# Patient Record
Sex: Female | Born: 1950 | Race: White | Hispanic: No | Smoking: Never smoker
Health system: Southern US, Community
[De-identification: ages and names within clinical notes are randomized; demographics above are authoritative.]

## PROBLEM LIST (undated history)

## (undated) DIAGNOSIS — K644 Residual hemorrhoidal skin tags: Secondary | ICD-10-CM

## (undated) DIAGNOSIS — E785 Hyperlipidemia, unspecified: Secondary | ICD-10-CM

## (undated) DIAGNOSIS — D126 Benign neoplasm of colon, unspecified: Secondary | ICD-10-CM

## (undated) DIAGNOSIS — K5790 Diverticulosis of intestine, part unspecified, without perforation or abscess without bleeding: Secondary | ICD-10-CM

## (undated) DIAGNOSIS — I340 Nonrheumatic mitral (valve) insufficiency: Secondary | ICD-10-CM

## (undated) DIAGNOSIS — E538 Deficiency of other specified B group vitamins: Secondary | ICD-10-CM

## (undated) DIAGNOSIS — I471 Supraventricular tachycardia, unspecified: Secondary | ICD-10-CM

## (undated) DIAGNOSIS — K648 Other hemorrhoids: Secondary | ICD-10-CM

## (undated) DIAGNOSIS — M8580 Other specified disorders of bone density and structure, unspecified site: Secondary | ICD-10-CM

## (undated) DIAGNOSIS — L900 Lichen sclerosus et atrophicus: Secondary | ICD-10-CM

## (undated) HISTORY — DX: Deficiency of other specified B group vitamins: E53.8

## (undated) HISTORY — DX: Supraventricular tachycardia: I47.1

## (undated) HISTORY — DX: Other hemorrhoids: K64.8

## (undated) HISTORY — DX: Benign neoplasm of colon, unspecified: D12.6

## (undated) HISTORY — DX: Supraventricular tachycardia, unspecified: I47.10

## (undated) HISTORY — DX: Residual hemorrhoidal skin tags: K64.4

## (undated) HISTORY — PX: BREAST EXCISIONAL BIOPSY: SUR124

## (undated) HISTORY — DX: Nonrheumatic mitral (valve) insufficiency: I34.0

## (undated) HISTORY — DX: Hyperlipidemia, unspecified: E78.5

## (undated) HISTORY — DX: Lichen sclerosus et atrophicus: L90.0

## (undated) HISTORY — PX: COLONOSCOPY: SHX174

## (undated) HISTORY — PX: BREAST BIOPSY: SHX20

## (undated) HISTORY — DX: Other specified disorders of bone density and structure, unspecified site: M85.80

## (undated) HISTORY — DX: Diverticulosis of intestine, part unspecified, without perforation or abscess without bleeding: K57.90

---

## 2013-11-24 ENCOUNTER — Other Ambulatory Visit: Payer: Self-pay

## 2013-12-30 ENCOUNTER — Encounter: Payer: Self-pay | Admitting: Internal Medicine

## 2013-12-30 ENCOUNTER — Ambulatory Visit (INDEPENDENT_AMBULATORY_CARE_PROVIDER_SITE_OTHER): Payer: BC Managed Care – PPO | Admitting: Internal Medicine

## 2013-12-30 ENCOUNTER — Other Ambulatory Visit: Payer: Self-pay | Admitting: Internal Medicine

## 2013-12-30 VITALS — BP 124/86 | HR 65 | Temp 97.8°F | Resp 12 | Ht 68.0 in | Wt 118.0 lb

## 2013-12-30 DIAGNOSIS — E785 Hyperlipidemia, unspecified: Secondary | ICD-10-CM

## 2013-12-30 DIAGNOSIS — Z872 Personal history of diseases of the skin and subcutaneous tissue: Secondary | ICD-10-CM | POA: Insufficient documentation

## 2013-12-30 DIAGNOSIS — Z1231 Encounter for screening mammogram for malignant neoplasm of breast: Secondary | ICD-10-CM

## 2013-12-30 DIAGNOSIS — L900 Lichen sclerosus et atrophicus: Secondary | ICD-10-CM | POA: Insufficient documentation

## 2013-12-30 NOTE — Patient Instructions (Signed)
We will send your prescription for your mammogram to the breast center. You can call them to schedule.   Think about calling your insurance company to see if they will cover the shingles shot (zostavax) and if you need to get it at our office or at a pharmacy. If you decide to do that you can just call our office.  We will see you back next year for your physical and if you have any problems or questions before then please call our office.

## 2013-12-30 NOTE — Assessment & Plan Note (Signed)
Has followed with derm and ob/gyn and currently under reasonable control.

## 2013-12-30 NOTE — Assessment & Plan Note (Signed)
She's had fewer since stopping caffeine and order mammogram today.

## 2013-12-30 NOTE — Assessment & Plan Note (Signed)
Check lipid panel today. Not currently on medication but reminded about healthy diet and lifestyle.

## 2013-12-30 NOTE — Progress Notes (Signed)
Pre visit review using our clinic review tool, if applicable. No additional management support is needed unless otherwise documented below in the visit note. 

## 2013-12-30 NOTE — Progress Notes (Signed)
   Subjective:    Patient ID: Patricia Dillon, female    DOB: 19-May-1950, 62 y.o.   MRN: [id number]  HPI The patient is a 63 YO female who comes in today to establish care. She has PMH of lichen sclerosis, vitamin D deficiency. She is not having any problems right now but wanted to get a mammogram. She denies chest pains, SOB, abdominal pain, GERD. She denies joint pains or aches, balance problems.   Review of Systems  Constitutional: Negative for fever, activity change, appetite change, fatigue and unexpected weight change.  HENT: Negative.   Respiratory: Negative for cough, chest tightness, shortness of breath and wheezing.   Cardiovascular: Negative for chest pain, palpitations and leg swelling.  Gastrointestinal: Negative for abdominal pain, diarrhea, constipation and abdominal distention.  Musculoskeletal: Negative.   Skin: Negative.   Neurological: Negative.       Objective:   Physical Exam  Constitutional: She is oriented to person, place, and time. She appears well-developed and well-nourished.  HENT:  Head: Normocephalic and atraumatic.  Eyes: EOM are normal.  Neck: Normal range of motion.  Cardiovascular: Normal rate and regular rhythm.   No murmur heard. Pulmonary/Chest: Effort normal and breath sounds normal. No respiratory distress. She has no wheezes. She has no rales.  Abdominal: Soft. Bowel sounds are normal. She exhibits no distension. There is no tenderness. There is no rebound.  Musculoskeletal: She exhibits no edema or tenderness.  Neurological: She is alert and oriented to person, place, and time.  Skin: Skin is warm and dry.   Filed Vitals:   12/30/13 0923  BP: 124/86  Pulse: 65  Temp: 97.8 F (36.6 C)  TempSrc: Oral  Resp: 12  Height: 5\' 8"  (1.727 m)  Weight: 118 lb (53.524 kg)  SpO2: 98%      Assessment & Plan:

## 2014-01-14 ENCOUNTER — Encounter: Payer: Self-pay | Admitting: Internal Medicine

## 2014-01-22 ENCOUNTER — Ambulatory Visit: Payer: Self-pay

## 2014-01-22 ENCOUNTER — Ambulatory Visit: Payer: BC Managed Care – PPO

## 2014-02-03 ENCOUNTER — Ambulatory Visit
Admission: RE | Admit: 2014-02-03 | Discharge: 2014-02-03 | Disposition: A | Discharge status: Home / Self Care | Payer: BLUE CROSS/BLUE SHIELD | Source: Ambulatory Visit | Attending: Internal Medicine | Admitting: Internal Medicine

## 2014-02-03 DIAGNOSIS — Z1231 Encounter for screening mammogram for malignant neoplasm of breast: Secondary | ICD-10-CM

## 2014-02-05 ENCOUNTER — Ambulatory Visit: Payer: Self-pay

## 2014-04-09 ENCOUNTER — Encounter: Payer: Self-pay | Admitting: Internal Medicine

## 2014-04-09 ENCOUNTER — Ambulatory Visit (INDEPENDENT_AMBULATORY_CARE_PROVIDER_SITE_OTHER): Payer: BLUE CROSS/BLUE SHIELD | Admitting: Internal Medicine

## 2014-04-09 VITALS — BP 100/62 | HR 66 | Temp 98.2°F | Resp 12 | Ht 68.0 in | Wt 119.0 lb

## 2014-04-09 DIAGNOSIS — E041 Nontoxic single thyroid nodule: Secondary | ICD-10-CM | POA: Diagnosis not present

## 2014-04-09 NOTE — Patient Instructions (Addendum)
Please come back in 1 year. 

## 2014-04-09 NOTE — Progress Notes (Signed)
Patient ID: Patricia Dillon, female   DOB: Apr 14, 1950, 64 y.o.   MRN: [id number]   HPI  Patricia Dillon is a 64 y.o.-year-old female, referred by her PCP, Dr. Doug Sou, for management of thyroid nodule. She moved from [place] in 10/2013.   Her thyroid nodule was incidentally found on MRI of the neck in 2010. It was followed with serial U/S's.   Thyroid U/S - R thyroid nodule: - 10/27/2012: 0.72 x 0.55 x 0.71 cm - has vascularity and 2 hyperechoic areas that appear as microcalcifications - 07/12/2010: 0.63 x 0.37 x 0.39 cm - 05/07/2009: 0.56 x 0.36 x 0.47 cm - 09/30/2008: 0.7 x 0.4 x 0.4 cm Thyroid gland was homogeneous on U/S.  2014:   I reviewed pt's thyroid tests: 08/16/2012: TSH 2.480, fT4 1.23   Pt denies feeling nodules in neck, hoarseness, dysphagia/odynophagia, SOB with lying down.  Pt c/o: - + palpitations - not recent Denies: - heat intolerance/cold intolerance - tremors - anxiety/depression - hyperdefecation/constipation - weight loss - weight gain - dry skin - hair falling - problems with concentration - fatigue  Pt does not have a FH of thyroid ds. No FH of thyroid cancer. No h/o radiation tx to head or neck.  No seaweed or kelp, no recent contrast studies. No steroid use. No herbal supplements.   I reviewed her chart and she also has a history of Mitral regurgitation. She also has osteopenia. .  ROS: Constitutional: no weight gain/loss, no fatigue, no subjective hyperthermia/hypothermia Eyes: no blurry vision, no xerophthalmia ENT: no sore throat, no nodules palpated in throat, no dysphagia/odynophagia, no hoarseness Cardiovascular: no CP/SOB/+ palpitations/no leg swelling Respiratory: no cough/SOB Gastrointestinal: no N/V/D/C Musculoskeletal: no muscle/joint aches Skin: no rashes Neurological: no tremors/numbness/tingling/dizziness Psychiatric: no depression/anxiety  Past Medical History  Diagnosis Date  . Hyperlipidemia   . Mitral valve  regurgitation   . Lichen sclerosus    Past Surgical History  Procedure Laterality Date  . Breast biopsy     History   Social History  . Marital Status: Divorced    Spouse Name: N/A  . Number of Children: 2   Occupational History  . Scientist, water quality.   Social History Main Topics  . Smoking status: Never Smoker   . Smokeless tobacco: Not on file  . Alcohol Use: No  . Drug Use: No   Current Outpatient Prescriptions on File Prior to Visit  Medication Sig Dispense Refill  . Cholecalciferol (VITAMIN D) 2000 UNITS tablet Take 2,000 Units by mouth daily.    Marland Kitchen PRAMOSONE E 1-2.5 % CREA Apply 1 - 2 times daily as needed.  2   No current facility-administered medications on file prior to visit.   Allergies  Allergen Reactions  . Penicillins   . Vancomycin    Family History  Problem Relation Age of Onset  . Hypertension Mother   . Heart disease Father    PE: BP 100/62 mmHg  Pulse 66  Temp(Src) 98.2 F (36.8 C) (Oral)  Resp 12  Ht 5\' 8"  (1.727 m)  Wt 119 lb (53.978 kg)  BMI 18.10 kg/m2  SpO2 97% Wt Readings from Last 3 Encounters:  04/09/14 119 lb (53.978 kg)  12/30/13 118 lb (53.524 kg)   Constitutional: normal weight, in NAD Eyes: PERRLA, EOMI, no exophthalmos ENT: moist mucous membranes, no thyromegaly, no cervical lymphadenopathy Cardiovascular: RRR (with few skipped beats), No MRG Respiratory: CTA B Gastrointestinal: abdomen soft, NT, ND, BS+ Musculoskeletal: no deformities, strength intact in all 4;  Skin: moist,  warm, no rashes Neurological: no tremor with outstretched hands, DTR normal in all 4  ASSESSMENT: 1. Thyroid nodule - small  PLAN: 1.  - I reviewed the images of her thyroid ultrasound along with the patient (she brought the CD of her latest U/S from 2014). I pointed out that the R thyroid nodule is very small, without internal blood flow, more wide than tall, and well delimited from surrounding tissue. It is hypoechoic and has 2 possible calcifications  vs colloid crystals. Pt does not have a thyroid cancer family history or a personal history of RxTx to head/neck. Moreover, the nodule was stable in size over 5 years: 2010-2014.  - the only way that we can tell exactly if it is cancer or not is by doing a thyroid biopsy (FNA), however, the nodule is too small for this - We discussed about repeating the U/S in 1 year to follow size and if growing, my threshold for Bx would be 1 cm - we discussed about thyroid cancer >> very indolent, slow growing - she agrees to wait 1 more year until the next U/S - she should let me know if she develops neck compression symptoms, in that case, we might need to do either lobectomy or thyroidectomy - I'll see her back in a year  - check TSH today - I advised pt to join my chart and I will send her the results through there   Lab Results  Component Value Date   TSH 1.300 04/09/2014   Thyroiditis is normal.

## 2014-04-10 LAB — TSH: TSH: 1.3 u[IU]/mL (ref 0.450–4.500)

## 2014-05-18 ENCOUNTER — Encounter: Payer: Self-pay | Admitting: Internal Medicine

## 2014-11-30 ENCOUNTER — Ambulatory Visit (INDEPENDENT_AMBULATORY_CARE_PROVIDER_SITE_OTHER): Payer: BLUE CROSS/BLUE SHIELD | Admitting: Internal Medicine

## 2014-11-30 ENCOUNTER — Encounter: Payer: Self-pay | Admitting: Internal Medicine

## 2014-11-30 VITALS — BP 128/82 | HR 74 | Temp 98.4°F | Resp 20 | Ht 68.0 in | Wt 119.1 lb

## 2014-11-30 DIAGNOSIS — Z299 Encounter for prophylactic measures, unspecified: Secondary | ICD-10-CM

## 2014-11-30 DIAGNOSIS — Z418 Encounter for other procedures for purposes other than remedying health state: Secondary | ICD-10-CM | POA: Diagnosis not present

## 2014-11-30 DIAGNOSIS — R590 Localized enlarged lymph nodes: Secondary | ICD-10-CM | POA: Diagnosis not present

## 2014-11-30 NOTE — Progress Notes (Signed)
Subjective:    Patient ID: Patricia Dillon, female    DOB: [birth year], 64 y.o.   MRN: [id number]  HPI She is here for an acute visit for the flu shot and she has noticed some swollen lymph nodes in her neck.  She had the tdap on 11/3 and is unsure if that is related.   She had the flu shot several years ago and after had tingling in her scalp.  She is unsure if that was a side effect or allergic reaction - she has not had one since then.  She wants one now because her daughter is pregnant and she knows she should have one.   Her swollen neck glands started two weeks ago.  They are better some days, but never completely go away.  She has left sided TMJ and has been having some left ear pain.  She is unsure if the pain is related to TMJ or something else.  She does not feel sick.  She is a little more tired, but her daughter has placenta previa and she has been worried about her.      Medications and allergies reviewed with patient and updated if appropriate.  Patient Active Problem List   Diagnosis Date Noted  . Thyroid nodule 04/09/2014  . Lichen sclerosus 123456  . Hyperlipidemia 12/30/2013  . Hx of cyst of breast 12/30/2013    Current Outpatient Prescriptions on File Prior to Visit  Medication Sig Dispense Refill  . Cholecalciferol (VITAMIN D) 2000 UNITS tablet Take 1,000 Units by mouth daily.     Marland Kitchen PRAMOSONE E 1-2.5 % CREA Apply 1 - 2 times daily as needed.  2   No current facility-administered medications on file prior to visit.    Past Medical History  Diagnosis Date  . Hyperlipidemia   . Mitral valve regurgitation   . Lichen sclerosus     Past Surgical History  Procedure Laterality Date  . Breast biopsy      Social History   Social History  . Marital Status: Divorced    Spouse Name: N/A  . Number of Children: N/A  . Years of Education: N/A   Social History Main Topics  . Smoking status: Never Smoker   . Smokeless tobacco: None  . Alcohol Use: No  .  Drug Use: No  . Sexual Activity: Not Asked   Other Topics Concern  . None   Social History Narrative    Review of Systems  Constitutional: Negative for fever, chills and appetite change.  HENT: Positive for ear pain (left, ? TMJ) and sore throat (scratchy - ? related to having heat on). Negative for congestion, postnasal drip, sinus pressure and sneezing.   Respiratory: Negative for cough, shortness of breath and wheezing.   Cardiovascular: Negative for chest pain and palpitations.  Gastrointestinal: Negative for nausea, abdominal pain, diarrhea and constipation.       Objective:   Filed Vitals:   11/30/14 0936  BP: 128/82  Pulse: 74  Temp: 98.4 F (36.9 C)  Resp: 20   Filed Weights   11/30/14 0936  Weight: 119 lb 2 oz (54.035 kg)   Body mass index is 18.12 kg/(m^2).   Physical Exam  Constitutional: She appears well-developed and well-nourished. No distress.  HENT:  Head: Normocephalic and atraumatic.  Right Ear: External ear normal.  Left Ear: External ear normal.  Nose: Nose normal.  Mouth/Throat: Oropharynx is clear and moist. No oropharyngeal exudate.  Bilateral ear canals and tympanic membranes  normal  Eyes: Conjunctivae are normal.  Neck: Neck supple. No tracheal deviation present. No thyromegaly present.  Cardiovascular: Normal rate, regular rhythm and normal heart sounds.   No murmur heard. Pulmonary/Chest: Effort normal and breath sounds normal. No respiratory distress. She has no wheezes. She has no rales.  Musculoskeletal: She exhibits no edema.  Lymphadenopathy:    She has cervical adenopathy (Mild, bilateral anterior cervical; no occipital, mandibular, auricular or supraclavicular lymphadenopathy).  Skin: Skin is warm and dry. No rash noted. She is not diaphoretic.  Psychiatric: She has a normal mood and affect. Her behavior is normal.        Assessment & Plan:   Flu shot given today - she is not febrile.  Discussed possible side effects and  advised her to call with any questions/concerns  Cervical lymphadenopathy Mild anterior cervical lymphadenopathy, no other neck lympahdenopathy No concerning symptoms or exam findings No further evaluation at this time She did have a cbc earlier this month through gyn which she reports was normal She will monitor lymph nodes for now - if they persist we can order a neck US to evaluate thyroid (has a known nodule that is small and has been stable) and neck lymph nodes  Follow up as needed

## 2014-11-30 NOTE — Patient Instructions (Signed)
Monitor your swollen lymph nodes - if they persist call and let me know.  You received the flu vaccine today.

## 2014-11-30 NOTE — Progress Notes (Signed)
Pre visit review using our clinic review tool, if applicable. No additional management support is needed unless otherwise documented below in the visit note. 

## 2014-12-31 ENCOUNTER — Other Ambulatory Visit: Payer: Self-pay

## 2014-12-31 DIAGNOSIS — Z1231 Encounter for screening mammogram for malignant neoplasm of breast: Secondary | ICD-10-CM

## 2015-02-22 ENCOUNTER — Ambulatory Visit
Admission: RE | Admit: 2015-02-22 | Discharge: 2015-02-22 | Disposition: A | Discharge status: Home / Self Care | Payer: BLUE CROSS/BLUE SHIELD | Source: Ambulatory Visit

## 2015-02-22 DIAGNOSIS — Z1231 Encounter for screening mammogram for malignant neoplasm of breast: Secondary | ICD-10-CM

## 2015-03-25 ENCOUNTER — Encounter: Payer: BLUE CROSS/BLUE SHIELD | Admitting: Internal Medicine

## 2015-04-09 ENCOUNTER — Ambulatory Visit: Payer: BLUE CROSS/BLUE SHIELD | Admitting: Internal Medicine

## 2015-05-03 ENCOUNTER — Encounter: Payer: BLUE CROSS/BLUE SHIELD | Admitting: Internal Medicine

## 2015-05-06 ENCOUNTER — Ambulatory Visit (INDEPENDENT_AMBULATORY_CARE_PROVIDER_SITE_OTHER): Payer: BLUE CROSS/BLUE SHIELD | Admitting: Internal Medicine

## 2015-05-06 VITALS — BP 112/70 | HR 55 | Wt 122.6 lb

## 2015-05-06 DIAGNOSIS — E041 Nontoxic single thyroid nodule: Secondary | ICD-10-CM | POA: Diagnosis not present

## 2015-05-06 NOTE — Progress Notes (Signed)
Pre visit review using our clinic review tool, if applicable. No additional management support is needed unless otherwise documented below in the visit note. 

## 2015-05-06 NOTE — Progress Notes (Signed)
Patient ID: Patricia Dillon, female   DOB: [birth year], 65 y.o.   MRN: [id number]   HPI  Patricia Dillon is a 65 y.o.-year-old female, initially referred by her PCP, Dr. Doug Sou, for management of thyroid nodule. She moved from [place] in 10/2013. Last visit 1 year ago.  Her thyroid nodule was incidentally found on MRI of the neck in 2010. It was followed with serial U/S's.   Thyroid U/S - R thyroid nodule: - 09/30/2008: 0.7 x 0.4 x 0.4 cm - 05/07/2009: 0.56 x 0.36 x 0.47 cm - 07/12/2010: 0.63 x 0.37 x 0.39 cm - 10/27/2012: 0.72 x 0.55 x 0.71 cm - 2 hyperechoic areas that appear as microcalcifications  Thyroid gland was homogeneous on U/S.  Images from the 2014 U/S:   I reviewed pt's thyroid tests: Lab Results  Component Value Date   TSH 1.300 04/09/2014  08/16/2012: TSH 2.480, fT4 1.23   Pt denies feeling nodules in neck, hoarseness, dysphagia/odynophagia, SOB with lying down.  Pt c/o: - + palpitations - not recent Denies: - heat intolerance/cold intolerance - tremors - anxiety/depression - hyperdefecation/constipation - weight loss - weight gain - dry skin - hair falling - problems with concentration - fatigue  Pt does not have a FH of thyroid ds. No FH of thyroid cancer. No h/o radiation tx to head or neck.  No seaweed or kelp, no recent contrast studies. No steroid use. No herbal supplements.   I reviewed her chart and she also has a history of Mitral regurgitation. She also has osteopenia. .  ROS: Constitutional: no weight gain/loss, no fatigue, no subjective hyperthermia/hypothermia Eyes: no blurry vision, no xerophthalmia ENT: no sore throat, no nodules palpated in throat, no dysphagia/odynophagia, no hoarseness Cardiovascular: no CP/SOB/+ palpitations/no leg swelling Respiratory: no cough/SOB Gastrointestinal: no N/V/D/C Musculoskeletal: no muscle/joint aches Skin: no rashes Neurological: no tremors/numbness/tingling/dizziness  I reviewed pt's  medications, allergies, PMH, social hx, family hx, and changes were documented in the history of present illness. Otherwise, unchanged from my initial visit note.  Past Medical History  Diagnosis Date  . Hyperlipidemia   . Mitral valve regurgitation   . Lichen sclerosus    Past Surgical History  Procedure Laterality Date  . Breast biopsy     History   Social History  . Marital Status: Divorced    Spouse Name: N/A  . Number of Children: 2   Occupational History  . Scientist, water quality.   Social History Main Topics  . Smoking status: Never Smoker   . Smokeless tobacco: Not on file  . Alcohol Use: No  . Drug Use: No   Current Outpatient Prescriptions on File Prior to Visit  Medication Sig Dispense Refill  . Cholecalciferol (VITAMIN D) 2000 UNITS tablet Take 1,000 Units by mouth daily.     Marland Kitchen PRAMOSONE E 1-2.5 % CREA Apply 1 - 2 times daily as needed.  2   No current facility-administered medications on file prior to visit.   Allergies  Allergen Reactions  . Penicillins   . Vancomycin    Family History  Problem Relation Age of Onset  . Hypertension Mother   . Heart disease Father    PE: BP 112/70 mmHg  Pulse 55  Wt 122 lb 9.6 oz (55.611 kg)  SpO2 97% Body mass index is 18.65 kg/(m^2). Wt Readings from Last 3 Encounters:  05/06/15 122 lb 9.6 oz (55.611 kg)  11/30/14 119 lb 2 oz (54.035 kg)  04/09/14 119 lb (53.978 kg)   Constitutional: normal weight, in  NAD Eyes: PERRLA, EOMI, no exophthalmos ENT: moist mucous membranes, no thyromegaly, no cervical lymphadenopathy Cardiovascular: RRR (with skipped beats), No MRG Respiratory: CTA B Gastrointestinal: abdomen soft, NT, ND, BS+ Musculoskeletal: no deformities, strength intact in all 4;  Skin: moist, warm, no rashes Neurological: no tremor with outstretched hands, DTR normal in all 4  ASSESSMENT: 1. R Thyroid nodule - small  PLAN: 1.  - I reviewed the images of her 2014 thyroid ultrasound along with the patient - the R  thyroid nodule is very small, without internal blood flow, more wide than tall, and well delimited from surrounding tissue. It is hypoechoic and has 2 possible calcifications vs colloid crystals. Pt does not have a thyroid cancer family history or a personal history of RxTx to head/neck. Moreover, the nodule was stable in size over 5 years: 2010-2014.  - the only way that we can tell exactly if it is cancer or not is by doing a thyroid biopsy (FNA), however, the nodule is too small for this - We discussed about repeating the U/S now to follow size and if growing, we may need to Bx it - she should let me know if she develops neck compression symptoms - I'll see her back in 2 years - she will have a TSH checked next mo (APE with PCP). Last TSH 1 year ago >> normal. - I advised pt to join my chart and I will send her the results through there   05/10/2015: Thyroid ultrasound:  Right thyroid lobe: 40 x 13 x 14 mm.  - 9 x 7 x 8 mm solid nodule with coarse calcification, inferior pole (previously 7 x 6 x 7).  - 3 mm hypoechoic nodule, superior pole.  Left thyroid lobe: 42 x 9 x 11 mm.  - Two small colloid nodules less than 4 mm.  Isthmus Thickness: 1.4 mm. No nodules visualized.  Lymphadenopathy: None visualized.  IMPRESSION: 1. Small bilateral nodules as above. Findings do not meet current consensus criteria for biopsy. Follow-up by clinical exam is recommended.    Reviewed images >> nodule appears isoechoic, and the "coarse calcification" appears to be a colloid granule with comet tail sign. The nodule appears low risk for cancer >> will continue to follow, but no Bx needed for now.

## 2015-05-06 NOTE — Patient Instructions (Signed)
Please schedule a new thyroid U/S.   Please have a TSH checked at next visit with PCP.  Please return in 2 years.

## 2015-05-10 ENCOUNTER — Ambulatory Visit
Admission: RE | Admit: 2015-05-10 | Discharge: 2015-05-10 | Disposition: A | Discharge status: Home / Self Care | Payer: BLUE CROSS/BLUE SHIELD | Source: Ambulatory Visit | Attending: Internal Medicine | Admitting: Internal Medicine

## 2015-05-10 ENCOUNTER — Inpatient Hospital Stay
Admission: RE | Admit: 2015-05-10 | Discharge: 2015-05-10 | Disposition: A | Discharge status: Home / Self Care | Payer: Self-pay | Source: Ambulatory Visit | Attending: Internal Medicine | Admitting: Internal Medicine

## 2015-05-10 ENCOUNTER — Other Ambulatory Visit: Payer: Self-pay | Admitting: Internal Medicine

## 2015-05-10 DIAGNOSIS — E041 Nontoxic single thyroid nodule: Secondary | ICD-10-CM

## 2015-05-12 ENCOUNTER — Encounter: Payer: Self-pay | Admitting: Internal Medicine

## 2015-06-24 ENCOUNTER — Other Ambulatory Visit (INDEPENDENT_AMBULATORY_CARE_PROVIDER_SITE_OTHER): Payer: BLUE CROSS/BLUE SHIELD

## 2015-06-24 ENCOUNTER — Encounter: Payer: Self-pay | Admitting: Internal Medicine

## 2015-06-24 ENCOUNTER — Ambulatory Visit (INDEPENDENT_AMBULATORY_CARE_PROVIDER_SITE_OTHER): Payer: BLUE CROSS/BLUE SHIELD | Admitting: Internal Medicine

## 2015-06-24 VITALS — BP 116/60 | HR 83 | Temp 98.3°F | Resp 12 | Ht 68.0 in | Wt 122.8 lb

## 2015-06-24 DIAGNOSIS — Z0000 Encounter for general adult medical examination without abnormal findings: Secondary | ICD-10-CM | POA: Diagnosis not present

## 2015-06-24 DIAGNOSIS — E785 Hyperlipidemia, unspecified: Secondary | ICD-10-CM

## 2015-06-24 LAB — LIPID PANEL
CHOL/HDL RATIO: 3
Cholesterol: 196 mg/dL (ref 0–200)
HDL: 58.7 mg/dL (ref >39.00)
LDL Cholesterol: 108 mg/dL — ABNORMAL HIGH (ref 0–99)
NONHDL: 137.36
Triglycerides: 147 mg/dL (ref 0.0–149.0)
VLDL: 29.4 mg/dL (ref 0.0–40.0)

## 2015-06-24 LAB — COMPREHENSIVE METABOLIC PANEL
ALBUMIN: 4.5 g/dL (ref 3.5–5.2)
ALK PHOS: 100 U/L (ref 39–117)
ALT: 17 U/L (ref 0–35)
AST: 22 U/L (ref 0–37)
BUN: 15 mg/dL (ref 6–23)
CO2: 34 mEq/L — ABNORMAL HIGH (ref 19–32)
CREATININE: 0.9 mg/dL (ref 0.40–1.20)
Calcium: 9.5 mg/dL (ref 8.4–10.5)
Chloride: 104 mEq/L (ref 96–112)
GFR: 67.09 mL/min (ref >60.00)
GLUCOSE: 96 mg/dL (ref 70–99)
Potassium: 4.3 mEq/L (ref 3.5–5.1)
SODIUM: 137 meq/L (ref 135–145)
TOTAL PROTEIN: 7.3 g/dL (ref 6.0–8.3)
Total Bilirubin: 1 mg/dL (ref 0.2–1.2)

## 2015-06-24 LAB — CBC
HCT: 39.2 % (ref 36.0–46.0)
HEMOGLOBIN: 13.2 g/dL (ref 12.0–15.0)
MCHC: 33.7 g/dL (ref 30.0–36.0)
MCV: 90.9 fl (ref 78.0–100.0)
PLATELETS: 114 10*3/uL — AB (ref 150.0–400.0)
RBC: 4.32 Mil/uL (ref 3.87–5.11)
RDW: 13.2 % (ref 11.5–15.5)
WBC: 6.1 10*3/uL (ref 4.0–10.5)

## 2015-06-24 LAB — VITAMIN D 25 HYDROXY (VIT D DEFICIENCY, FRACTURES): VITD: 33.16 ng/mL (ref 30.00–100.00)

## 2015-06-24 LAB — TSH: TSH: 1.18 u[IU]/mL (ref 0.35–4.50)

## 2015-06-24 NOTE — Assessment & Plan Note (Signed)
Checking labs, colonoscopy and mammogram up to date. Declines HIV and hep c screening as well as shingles shot today. Counseled about the dangers of distracted driving. Given screening recommendations.

## 2015-06-24 NOTE — Assessment & Plan Note (Signed)
Checking lipid panel, not on meds right now. Adjust as needed.

## 2015-06-24 NOTE — Progress Notes (Signed)
Pre visit review using our clinic review tool, if applicable. No additional management support is needed unless otherwise documented below in the visit note. 

## 2015-06-24 NOTE — Patient Instructions (Signed)
Keep up the good work with the health.   Think about getting checked for hepatitis C in the future if you want.   Health Maintenance, Female Adopting a healthy lifestyle and getting preventive care can go a long way to promote health and wellness. Talk with your health care provider about what schedule of regular examinations is right for you. This is a good chance for you to check in with your provider about disease prevention and staying healthy. In between checkups, there are plenty of things you can do on your own. Experts have done a lot of research about which lifestyle changes and preventive measures are most likely to keep you healthy. Ask your health care provider for more information. WEIGHT AND DIET  Eat a healthy diet  Be sure to include plenty of vegetables, fruits, low-fat dairy products, and lean protein.  Do not eat a lot of foods high in solid fats, added sugars, or salt.  Get regular exercise. This is one of the most important things you can do for your health.  Most adults should exercise for at least 150 minutes each week. The exercise should increase your heart rate and make you sweat (moderate-intensity exercise).  Most adults should also do strengthening exercises at least twice a week. This is in addition to the moderate-intensity exercise.  Maintain a healthy weight  Body mass index (BMI) is a measurement that can be used to identify possible weight problems. It estimates body fat based on height and weight. Your health care provider can help determine your BMI and help you achieve or maintain a healthy weight.  For females 27 years of age and older:   A BMI below 18.5 is considered underweight.  A BMI of 18.5 to 24.9 is normal.  A BMI of 25 to 29.9 is considered overweight.  A BMI of 30 and above is considered obese.  Watch levels of cholesterol and blood lipids  You should start having your blood tested for lipids and cholesterol at 64 years of age,  then have this test every 5 years.  You may need to have your cholesterol levels checked more often if:  Your lipid or cholesterol levels are high.  You are older than 64 years of age.  You are at high risk for heart disease.  CANCER SCREENING   Lung Cancer  Lung cancer screening is recommended for adults 80-25 years old who are at high risk for lung cancer because of a history of smoking.  A yearly low-dose CT scan of the lungs is recommended for people who:  Currently smoke.  Have quit within the past 15 years.  Have at least a 30-pack-year history of smoking. A pack year is smoking an average of one pack of cigarettes a day for 1 year.  Yearly screening should continue until it has been 15 years since you quit.  Yearly screening should stop if you develop a health problem that would prevent you from having lung cancer treatment.  Breast Cancer  Practice breast self-awareness. This means understanding how your breasts normally appear and feel.  It also means doing regular breast self-exams. Let your health care provider know about any changes, no matter how small.  If you are in your 20s or 30s, you should have a clinical breast exam (CBE) by a health care provider every 1-3 years as part of a regular health exam.  If you are 54 or older, have a CBE every year. Also consider having a breast X-ray (  mammogram) every year.  If you have a family history of breast cancer, talk to your health care provider about genetic screening.  If you are at high risk for breast cancer, talk to your health care provider about having an MRI and a mammogram every year.  Breast cancer gene (BRCA) assessment is recommended for women who have family members with BRCA-related cancers. BRCA-related cancers include:  Breast.  Ovarian.  Tubal.  Peritoneal cancers.  Results of the assessment will determine the need for genetic counseling and BRCA1 and BRCA2 testing. Cervical Cancer Your  health care provider may recommend that you be screened regularly for cancer of the pelvic organs (ovaries, uterus, and vagina). This screening involves a pelvic examination, including checking for microscopic changes to the surface of your cervix (Pap test). You may be encouraged to have this screening done every 3 years, beginning at age 34.  For women ages 68-65, health care providers may recommend pelvic exams and Pap testing every 3 years, or they may recommend the Pap and pelvic exam, combined with testing for human papilloma virus (HPV), every 5 years. Some types of HPV increase your risk of cervical cancer. Testing for HPV may also be done on women of any age with unclear Pap test results.  Other health care providers may not recommend any screening for nonpregnant women who are considered low risk for pelvic cancer and who do not have symptoms. Ask your health care provider if a screening pelvic exam is right for you.  If you have had past treatment for cervical cancer or a condition that could lead to cancer, you need Pap tests and screening for cancer for at least 20 years after your treatment. If Pap tests have been discontinued, your risk factors (such as having a new sexual partner) need to be reassessed to determine if screening should resume. Some women have medical problems that increase the chance of getting cervical cancer. In these cases, your health care provider may recommend more frequent screening and Pap tests. Colorectal Cancer  This type of cancer can be detected and often prevented.  Routine colorectal cancer screening usually begins at 64 years of age and continues through 64 years of age.  Your health care provider may recommend screening at an earlier age if you have risk factors for colon cancer.  Your health care provider may also recommend using home test kits to check for hidden blood in the stool.  A small camera at the end of a tube can be used to examine your  colon directly (sigmoidoscopy or colonoscopy). This is done to check for the earliest forms of colorectal cancer.  Routine screening usually begins at age 18.  Direct examination of the colon should be repeated every 5-10 years through 64 years of age. However, you may need to be screened more often if early forms of precancerous polyps or small growths are found. Skin Cancer  Check your skin from head to toe regularly.  Tell your health care provider about any new moles or changes in moles, especially if there is a change in a mole's shape or color.  Also tell your health care provider if you have a mole that is larger than the size of a pencil eraser.  Always use sunscreen. Apply sunscreen liberally and repeatedly throughout the day.  Protect yourself by wearing long sleeves, pants, a wide-brimmed hat, and sunglasses whenever you are outside. HEART DISEASE, DIABETES, AND HIGH BLOOD PRESSURE   High blood pressure causes heart disease  and increases the risk of stroke. High blood pressure is more likely to develop in:  People who have blood pressure in the high end of the normal range (130-139/85-89 mm Hg).  People who are overweight or obese.  People who are African American.  If you are 54-49 years of age, have your blood pressure checked every 3-5 years. If you are 51 years of age or older, have your blood pressure checked every year. You should have your blood pressure measured twice--once when you are at a hospital or clinic, and once when you are not at a hospital or clinic. Record the average of the two measurements. To check your blood pressure when you are not at a hospital or clinic, you can use:  An automated blood pressure machine at a pharmacy.  A home blood pressure monitor.  If you are between 71 years and 12 years old, ask your health care provider if you should take aspirin to prevent strokes.  Have regular diabetes screenings. This involves taking a blood sample to  check your fasting blood sugar level.  If you are at a normal weight and have a low risk for diabetes, have this test once every three years after 64 years of age.  If you are overweight and have a high risk for diabetes, consider being tested at a younger age or more often. PREVENTING INFECTION  Hepatitis B  If you have a higher risk for hepatitis B, you should be screened for this virus. You are considered at high risk for hepatitis B if:  You were born in a country where hepatitis B is common. Ask your health care provider which countries are considered high risk.  Your parents were born in a high-risk country, and you have not been immunized against hepatitis B (hepatitis B vaccine).  You have HIV or AIDS.  You use needles to inject street drugs.  You live with someone who has hepatitis B.  You have had sex with someone who has hepatitis B.  You get hemodialysis treatment.  You take certain medicines for conditions, including cancer, organ transplantation, and autoimmune conditions. Hepatitis C  Blood testing is recommended for:  Everyone born from [birth year] through 1964.  Anyone with known risk factors for hepatitis C. Sexually transmitted infections (STIs)  You should be screened for sexually transmitted infections (STIs) including gonorrhea and chlamydia if:  You are sexually active and are younger than 64 years of age.  You are older than 64 years of age and your health care provider tells you that you are at risk for this type of infection.  Your sexual activity has changed since you were last screened and you are at an increased risk for chlamydia or gonorrhea. Ask your health care provider if you are at risk.  If you do not have HIV, but are at risk, it may be recommended that you take a prescription medicine daily to prevent HIV infection. This is called pre-exposure prophylaxis (PrEP). You are considered at risk if:  You are sexually active and do not regularly use  condoms or know the HIV status of your partner(s).  You take drugs by injection.  You are sexually active with a partner who has HIV. Talk with your health care provider about whether you are at high risk of being infected with HIV. If you choose to begin PrEP, you should first be tested for HIV. You should then be tested every 3 months for as long as you are taking PrEP.  PREGNANCY   If you are premenopausal and you may become pregnant, ask your health care provider about preconception counseling.  If you may become pregnant, take 400 to 800 micrograms (mcg) of folic acid every day.  If you want to prevent pregnancy, talk to your health care provider about birth control (contraception). OSTEOPOROSIS AND MENOPAUSE   Osteoporosis is a disease in which the bones lose minerals and strength with aging. This can result in serious bone fractures. Your risk for osteoporosis can be identified using a bone density scan.  If you are 47 years of age or older, or if you are at risk for osteoporosis and fractures, ask your health care provider if you should be screened.  Ask your health care provider whether you should take a calcium or vitamin D supplement to lower your risk for osteoporosis.  Menopause may have certain physical symptoms and risks.  Hormone replacement therapy may reduce some of these symptoms and risks. Talk to your health care provider about whether hormone replacement therapy is right for you.  HOME CARE INSTRUCTIONS   Schedule regular health, dental, and eye exams.  Stay current with your immunizations.   Do not use any tobacco products including cigarettes, chewing tobacco, or electronic cigarettes.  If you are pregnant, do not drink alcohol.  If you are breastfeeding, limit how much and how often you drink alcohol.  Limit alcohol intake to no more than 1 drink per day for nonpregnant women. One drink equals 12 ounces of beer, 5 ounces of wine, or 1 ounces of hard  liquor.  Do not use street drugs.  Do not share needles.  Ask your health care provider for help if you need support or information about quitting drugs.  Tell your health care provider if you often feel depressed.  Tell your health care provider if you have ever been abused or do not feel safe at home.   This information is not intended to replace advice given to you by your health care provider. Make sure you discuss any questions you have with your health care provider.   Document Released: 07/04/2010 Document Revised: 01/09/2014 Document Reviewed: 11/20/2012 Elsevier Interactive Patient Education [employer].

## 2015-06-24 NOTE — Progress Notes (Signed)
   Subjective:    Patient ID: Patricia Dillon, female    DOB: 29-Oct-1950, 65 y.o.   MRN: [id number]  HPI The patient is a 65 YO female coming in for wellness. No new concerns.   PMH, [facility], social history reviewed and updated.   Review of Systems  Constitutional: Negative for fever, activity change, appetite change, fatigue and unexpected weight change.  HENT: Negative.   Eyes: Negative.   Respiratory: Negative for cough, chest tightness, shortness of breath and wheezing.   Cardiovascular: Negative for chest pain, palpitations and leg swelling.  Gastrointestinal: Negative for abdominal pain, diarrhea, constipation and abdominal distention.  Musculoskeletal: Negative.   Skin: Negative.   Neurological: Negative.       Objective:   Physical Exam  Constitutional: She is oriented to person, place, and time. She appears well-developed and well-nourished.  HENT:  Head: Normocephalic and atraumatic.  Eyes: EOM are normal.  Neck: Normal range of motion.  Cardiovascular: Normal rate and regular rhythm.   Carotids without bruit bilaterally.   Pulmonary/Chest: Effort normal and breath sounds normal. No respiratory distress. She has no wheezes. She has no rales.  Abdominal: Soft. Bowel sounds are normal. She exhibits no distension. There is no tenderness. There is no rebound.  Musculoskeletal: She exhibits no edema or tenderness.  Neurological: She is alert and oriented to person, place, and time.  Skin: Skin is warm and dry.  Psychiatric: She has a normal mood and affect.   Filed Vitals:   06/24/15 1058  BP: 116/60  Pulse: 83  Temp: 98.3 F (36.8 C)  TempSrc: Oral  Resp: 12  Height: 5\' 8"  (1.727 m)  Weight: 122 lb 12.8 oz (55.702 kg)  SpO2: 98%      Assessment & Plan:

## 2015-06-25 ENCOUNTER — Telehealth: Payer: Self-pay | Admitting: Internal Medicine

## 2016-02-16 ENCOUNTER — Other Ambulatory Visit: Payer: Self-pay | Admitting: Internal Medicine

## 2016-02-16 DIAGNOSIS — Z1231 Encounter for screening mammogram for malignant neoplasm of breast: Secondary | ICD-10-CM

## 2016-02-29 ENCOUNTER — Ambulatory Visit
Admission: RE | Admit: 2016-02-29 | Discharge: 2016-02-29 | Disposition: A | Discharge status: Home / Self Care | Payer: BLUE CROSS/BLUE SHIELD | Source: Ambulatory Visit | Attending: Internal Medicine | Admitting: Internal Medicine

## 2016-02-29 DIAGNOSIS — Z1231 Encounter for screening mammogram for malignant neoplasm of breast: Secondary | ICD-10-CM

## 2016-03-01 ENCOUNTER — Other Ambulatory Visit: Payer: Self-pay | Admitting: Internal Medicine

## 2016-03-01 DIAGNOSIS — R928 Other abnormal and inconclusive findings on diagnostic imaging of breast: Secondary | ICD-10-CM

## 2016-03-03 ENCOUNTER — Ambulatory Visit
Admission: RE | Admit: 2016-03-03 | Discharge: 2016-03-03 | Disposition: A | Discharge status: Home / Self Care | Payer: BLUE CROSS/BLUE SHIELD | Source: Ambulatory Visit | Attending: Internal Medicine | Admitting: Internal Medicine

## 2016-03-03 DIAGNOSIS — R928 Other abnormal and inconclusive findings on diagnostic imaging of breast: Secondary | ICD-10-CM

## 2016-06-26 ENCOUNTER — Ambulatory Visit (INDEPENDENT_AMBULATORY_CARE_PROVIDER_SITE_OTHER): Payer: BLUE CROSS/BLUE SHIELD | Admitting: Nurse Practitioner

## 2016-06-26 ENCOUNTER — Encounter: Payer: Self-pay | Admitting: Nurse Practitioner

## 2016-06-26 ENCOUNTER — Encounter: Payer: BLUE CROSS/BLUE SHIELD | Admitting: Internal Medicine

## 2016-06-26 VITALS — BP 120/64 | HR 71 | Temp 98.0°F | Ht 68.0 in | Wt 116.0 lb

## 2016-06-26 DIAGNOSIS — I340 Nonrheumatic mitral (valve) insufficiency: Secondary | ICD-10-CM | POA: Insufficient documentation

## 2016-06-26 DIAGNOSIS — I499 Cardiac arrhythmia, unspecified: Secondary | ICD-10-CM

## 2016-06-26 DIAGNOSIS — E782 Mixed hyperlipidemia: Secondary | ICD-10-CM

## 2016-06-26 DIAGNOSIS — I498 Other specified cardiac arrhythmias: Secondary | ICD-10-CM

## 2016-06-26 DIAGNOSIS — Z0000 Encounter for general adult medical examination without abnormal findings: Secondary | ICD-10-CM | POA: Diagnosis not present

## 2016-06-26 NOTE — Progress Notes (Signed)
Subjective:    Patient ID: Patricia Dillon, female    DOB: [birth year], 66 y.o.   MRN: [id number]  Patient presents today for complete physical   HPI  denies any acute complains.  Immunizations: (TDAP, Hep C screen, Pneumovax, Influenza, zoster)  Health Maintenance  Topic Date Due  .  Hepatitis C: One time screening is recommended by Center for Disease Control  (CDC) for  adults born from [birth year] through 1965.   06/04/2017*  . HIV Screening  06/04/2017*  . Flu Shot  08/02/2016  . Pap Smear  10/10/2016  . Mammogram  02/28/2018  . Colon Cancer Screening  01/03/2020  . Tetanus Vaccine  11/04/2024  *Topic was postponed. The date shown is not the original due date.   Diet:healthy.  Weight:  Wt Readings from Last 3 Encounters:  06/26/16 116 lb (52.6 kg)  06/24/15 122 lb 12.8 oz (55.7 kg)  05/06/15 122 lb 9.6 oz (55.6 kg)   Exercise: busy with caring for grandchild.  Fall Risk: Fall Risk  06/26/2016  Falls in the past year? No   Home Safety: home alone, but spend a lot of time with grandchild (90month).  Depression/Suicide: Depression screen [facility] 2/9 06/26/2016  Decreased Interest 0  Down, Depressed, Hopeless 0  PHQ - 2 Score 0   No flowsheet data found. Colonoscopy (every 5-85yrs, >50-12yrs):done 2012, normal per patient (internal and external hemorrhoids).  Pap Smear (every 20yrs for >21-29 without HPV, every 39yrs for >30-29yrs with HPV): last 11/2015, normal per patient, report request, done by Dr. Orvan Seen with [facility].  Mammogram (yearly, >76yrs):up to date  Dexa scan done 2014: osteopenia per patient.  Vision:up to date, use of corrective lens.  Dental:up to date, every 68months.  Advanced Directive: has a living will, copy requested.  No flowsheet data found. Sexual History (birth control, marital status, STD): divorced, 2 adult children , 1grandchild.  Medications and allergies reviewed with patient and updated if appropriate.  Patient Active  Problem List   Diagnosis Date Noted  . 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 Mitral valve regurgitation 06/26/2016  . Routine general medical examination at a health care facility 06/24/2015  . Thyroid nodule 04/09/2014  . Lichen sclerosus 82/42/3536  . Hyperlipidemia 12/30/2013  . Hx of cyst of breast 12/30/2013    Current Outpatient Prescriptions on File Prior to Visit  Medication Sig Dispense Refill  . PRAMOSONE E 1-2.5 % CREA Apply 1 - 2 times daily as needed.  2  . Cholecalciferol (VITAMIN D) 2000 UNITS tablet Take 1,000 Units by mouth daily.      No current facility-administered medications on file prior to visit.     Past Medical History:  Diagnosis Date  . Hyperlipidemia   . Lichen sclerosus   . Mitral valve regurgitation     Past Surgical History:  Procedure Laterality Date  . BREAST BIOPSY Left    benign    Social History   Social History  . Marital status: Divorced    Spouse name: N/A  . Number of children: N/A  . Years of education: N/A   Social History Main Topics  . Smoking status: Never Smoker  . Smokeless tobacco: Never Used  . Alcohol use No  . Drug use: No  . Sexual activity: Not Asked   Other Topics Concern  . None   Social History Narrative  . None    Family History  Problem Relation Age of Onset  . Hypertension Mother   .  Heart disease Father         Review of Systems  Constitutional: Negative for fever, malaise/fatigue and weight loss.  HENT: Negative for congestion and sore throat.   Eyes:       Negative for visual changes  Respiratory: Negative for cough and shortness of breath.   Cardiovascular: Negative for chest pain, palpitations and leg swelling.  Gastrointestinal: Negative for blood in stool, constipation, diarrhea and heartburn.  Genitourinary: Negative for dysuria, frequency and urgency.  Musculoskeletal: Negative for falls, joint pain and myalgias.  Skin: Negative for rash.  Neurological: Negative for dizziness,  sensory change and headaches.  Endo/Heme/Allergies: Does not bruise/bleed easily.  Psychiatric/Behavioral: Negative for depression, substance abuse and suicidal ideas. The patient is not nervous/anxious.     Objective:   Vitals:   06/26/16 1314  BP: 120/64  Pulse: 71  Temp: 98 F (36.7 C)    Body mass index is 17.64 kg/m.   Physical Examination:  Physical Exam  Constitutional: She is oriented to person, place, and time and well-developed, well-nourished, and in no distress. No distress.  HENT:  Right Ear: External ear normal.  Left Ear: External ear normal.  Nose: Nose normal.  Mouth/Throat: Oropharynx is clear and moist. No oropharyngeal exudate.  Eyes: Conjunctivae and EOM are normal. Pupils are equal, round, and reactive to light. No scleral icterus.  Neck: Normal range of motion. Neck supple. No thyromegaly present.  Cardiovascular: Normal rate and intact distal pulses.  A regularly irregular rhythm present.  Murmur heard. Chronic per patient.  Pulmonary/Chest: Effort normal and breath sounds normal. She exhibits no tenderness.  Abdominal: Soft. Bowel sounds are normal. She exhibits no distension. There is no tenderness.  Musculoskeletal: Normal range of motion. She exhibits no edema or tenderness.  Lymphadenopathy:    She has no cervical adenopathy.  Neurological: She is alert and oriented to person, place, and time. Gait normal.  Skin: Skin is warm and dry.  Psychiatric: Affect and judgment normal.    ASSESSMENT and PLAN:  [name] was seen today for annual exam.  Diagnoses and all orders for this visit:  Preventative health care -     CBC; Future -     Hepatic function panel; Future -     Basic metabolic panel; Future -     TSH; Future -     Lipid panel; Future -     T4, free; Future  Mixed hyperlipidemia -     Lipid panel; Future  Sinus arrhythmia   No problem-specific Assessment & Plan notes found for this encounter.     Follow up: Return if  symptoms worsen or fail to improve.  Wilfred Lacy, NP

## 2016-06-26 NOTE — Patient Instructions (Addendum)
Bring records of previous Holter monitor ECG and echocardiogram.  Go to lab fasting at least 6-8hrs prior to blood draw. You will be contacted with lab results.  She request to have lab draw at [place]. She also requested for labs to be resulted by [facility] only. Call [place] location to make appt for blood draw. Labs will be resulted by [facility], Female Adopting a healthy lifestyle and getting preventive care can go a long way to promote health and wellness. Talk with your health care provider about what schedule of regular examinations is right for you. This is a good chance for you to check in with your provider about disease prevention and staying healthy. In between checkups, there are plenty of things you can do on your own. Experts have done a lot of research about which lifestyle changes and preventive measures are most likely to keep you healthy. Ask your health care provider for more information. Weight and diet Eat a healthy diet  Be sure to include plenty of vegetables, fruits, low-fat dairy products, and lean protein.  Do not eat a lot of foods high in solid fats, added sugars, or salt.  Get regular exercise. This is one of the most important things you can do for your health. ? Most adults should exercise for at least 150 minutes each week. The exercise should increase your heart rate and make you sweat (moderate-intensity exercise). ? Most adults should also do strengthening exercises at least twice a week. This is in addition to the moderate-intensity exercise.  Maintain a healthy weight  Body mass index (BMI) is a measurement that can be used to identify possible weight problems. It estimates body fat based on height and weight. Your health care provider can help determine your BMI and help you achieve or maintain a healthy weight.  For females 65 years of age and older: ? A BMI below 18.5 is considered underweight. ? A BMI of  18.5 to 24.9 is normal. ? A BMI of 25 to 29.9 is considered overweight. ? A BMI of 30 and above is considered obese.  Watch levels of cholesterol and blood lipids  You should start having your blood tested for lipids and cholesterol at 65 years of age, then have this test every 5 years.  You may need to have your cholesterol levels checked more often if: ? Your lipid or cholesterol levels are high. ? You are older than 65 years of age. ? You are at high risk for heart disease.  Cancer screening Lung Cancer  Lung cancer screening is recommended for adults 4-77 years old who are at high risk for lung cancer because of a history of smoking.  A yearly low-dose CT scan of the lungs is recommended for people who: ? Currently smoke. ? Have quit within the past 15 years. ? Have at least a 30-pack-year history of smoking. A pack year is smoking an average of one pack of cigarettes a day for 1 year.  Yearly screening should continue until it has been 15 years since you quit.  Yearly screening should stop if you develop a health problem that would prevent you from having lung cancer treatment.  Breast Cancer  Practice breast self-awareness. This means understanding how your breasts normally appear and feel.  It also means doing regular breast self-exams. Let your health care provider know about any changes, no matter how small.  If you are in your 20s or 30s, you  should have a clinical breast exam (CBE) by a health care provider every 1-3 years as part of a regular health exam.  If you are 16 or older, have a CBE every year. Also consider having a breast X-ray (mammogram) every year.  If you have a family history of breast cancer, talk to your health care provider about genetic screening.  If you are at high risk for breast cancer, talk to your health care provider about having an MRI and a mammogram every year.  Breast cancer gene (BRCA) assessment is recommended for women who have  family members with BRCA-related cancers. BRCA-related cancers include: ? Breast. ? Ovarian. ? Tubal. ? Peritoneal cancers.  Results of the assessment will determine the need for genetic counseling and BRCA1 and BRCA2 testing.  Cervical Cancer Your health care provider may recommend that you be screened regularly for cancer of the pelvic organs (ovaries, uterus, and vagina). This screening involves a pelvic examination, including checking for microscopic changes to the surface of your cervix (Pap test). You may be encouraged to have this screening done every 3 years, beginning at age 52.  For women ages 72-65, health care providers may recommend pelvic exams and Pap testing every 3 years, or they may recommend the Pap and pelvic exam, combined with testing for human papilloma virus (HPV), every 5 years. Some types of HPV increase your risk of cervical cancer. Testing for HPV may also be done on women of any age with unclear Pap test results.  Other health care providers may not recommend any screening for nonpregnant women who are considered low risk for pelvic cancer and who do not have symptoms. Ask your health care provider if a screening pelvic exam is right for you.  If you have had past treatment for cervical cancer or a condition that could lead to cancer, you need Pap tests and screening for cancer for at least 20 years after your treatment. If Pap tests have been discontinued, your risk factors (such as having a new sexual partner) need to be reassessed to determine if screening should resume. Some women have medical problems that increase the chance of getting cervical cancer. In these cases, your health care provider may recommend more frequent screening and Pap tests.  Colorectal Cancer  This type of cancer can be detected and often prevented.  Routine colorectal cancer screening usually begins at 65 years of age and continues through 65 years of age.  Your health care provider may  recommend screening at an earlier age if you have risk factors for colon cancer.  Your health care provider may also recommend using home test kits to check for hidden blood in the stool.  A small camera at the end of a tube can be used to examine your colon directly (sigmoidoscopy or colonoscopy). This is done to check for the earliest forms of colorectal cancer.  Routine screening usually begins at age 44.  Direct examination of the colon should be repeated every 5-10 years through 65 years of age. However, you may need to be screened more often if early forms of precancerous polyps or small growths are found.  Skin Cancer  Check your skin from head to toe regularly.  Tell your health care provider about any new moles or changes in moles, especially if there is a change in a mole's shape or color.  Also tell your health care provider if you have a mole that is larger than the size of a pencil eraser.  Always use sunscreen. Apply sunscreen liberally and repeatedly throughout the day.  Protect yourself by wearing long sleeves, pants, a wide-brimmed hat, and sunglasses whenever you are outside.  Heart disease, diabetes, and high blood pressure  High blood pressure causes heart disease and increases the risk of stroke. High blood pressure is more likely to develop in: ? People who have blood pressure in the high end of the normal range (130-139/85-89 mm Hg). ? People who are overweight or obese. ? People who are African American.  If you are 70-28 years of age, have your blood pressure checked every 3-5 years. If you are 22 years of age or older, have your blood pressure checked every year. You should have your blood pressure measured twice-once when you are at a hospital or clinic, and once when you are not at a hospital or clinic. Record the average of the two measurements. To check your blood pressure when you are not at a hospital or clinic, you can use: ? An automated blood pressure  machine at a pharmacy. ? A home blood pressure monitor.  If you are between 28 years and 64 years old, ask your health care provider if you should take aspirin to prevent strokes.  Have regular diabetes screenings. This involves taking a blood sample to check your fasting blood sugar level. ? If you are at a normal weight and have a low risk for diabetes, have this test once every three years after 65 years of age. ? If you are overweight and have a high risk for diabetes, consider being tested at a younger age or more often. Preventing infection Hepatitis B  If you have a higher risk for hepatitis B, you should be screened for this virus. You are considered at high risk for hepatitis B if: ? You were born in a country where hepatitis B is common. Ask your health care provider which countries are considered high risk. ? Your parents were born in a high-risk country, and you have not been immunized against hepatitis B (hepatitis B vaccine). ? You have HIV or AIDS. ? You use needles to inject street drugs. ? You live with someone who has hepatitis B. ? You have had sex with someone who has hepatitis B. ? You get hemodialysis treatment. ? You take certain medicines for conditions, including cancer, organ transplantation, and autoimmune conditions.  Hepatitis C  Blood testing is recommended for: ? Everyone born from [birth year] through 1964. ? Anyone with known risk factors for hepatitis C.  Sexually transmitted infections (STIs)  You should be screened for sexually transmitted infections (STIs) including gonorrhea and chlamydia if: ? You are sexually active and are younger than 66 years of age. ? You are older than 65 years of age and your health care provider tells you that you are at risk for this type of infection. ? Your sexual activity has changed since you were last screened and you are at an increased risk for chlamydia or gonorrhea. Ask your health care provider if you are at  risk.  If you do not have HIV, but are at risk, it may be recommended that you take a prescription medicine daily to prevent HIV infection. This is called pre-exposure prophylaxis (PrEP). You are considered at risk if: ? You are sexually active and do not regularly use condoms or know the HIV status of your partner(s). ? You take drugs by injection. ? You are sexually active with a partner who has HIV.  Talk with  your health care provider about whether you are at high risk of being infected with HIV. If you choose to begin PrEP, you should first be tested for HIV. You should then be tested every 3 months for as long as you are taking PrEP. Pregnancy  If you are premenopausal and you may become pregnant, ask your health care provider about preconception counseling.  If you may become pregnant, take 400 to 800 micrograms (mcg) of folic acid every day.  If you want to prevent pregnancy, talk to your health care provider about birth control (contraception). Osteoporosis and menopause  Osteoporosis is a disease in which the bones lose minerals and strength with aging. This can result in serious bone fractures. Your risk for osteoporosis can be identified using a bone density scan.  If you are 65 years of age or older, or if you are at risk for osteoporosis and fractures, ask your health care provider if you should be screened.  Ask your health care provider whether you should take a calcium or vitamin D supplement to lower your risk for osteoporosis.  Menopause may have certain physical symptoms and risks.  Hormone replacement therapy may reduce some of these symptoms and risks. Talk to your health care provider about whether hormone replacement therapy is right for you. Follow these instructions at home:  Schedule regular health, dental, and eye exams.  Stay current with your immunizations.  Do not use any tobacco products including cigarettes, chewing tobacco, or electronic  cigarettes.  If you are pregnant, do not drink alcohol.  If you are breastfeeding, limit how much and how often you drink alcohol.  Limit alcohol intake to no more than 1 drink per day for nonpregnant women. One drink equals 12 ounces of beer, 5 ounces of wine, or 1 ounces of hard liquor.  Do not use street drugs.  Do not share needles.  Ask your health care provider for help if you need support or information about quitting drugs.  Tell your health care provider if you often feel depressed.  Tell your health care provider if you have ever been abused or do not feel safe at home. This information is not intended to replace advice given to you by your health care provider. Make sure you discuss any questions you have with your health care provider. Document Released: 07/04/2010 Document Revised: 05/27/2015 Document Reviewed: 09/22/2014 Elsevier Interactive Patient Education  [employer].

## 2016-07-11 ENCOUNTER — Other Ambulatory Visit: Payer: BLUE CROSS/BLUE SHIELD

## 2016-07-11 DIAGNOSIS — Z0000 Encounter for general adult medical examination without abnormal findings: Secondary | ICD-10-CM

## 2016-07-11 DIAGNOSIS — E782 Mixed hyperlipidemia: Secondary | ICD-10-CM

## 2016-07-14 LAB — BASIC METABOLIC PANEL
BUN/Creatinine Ratio: 22 (ref 12–28)
BUN: 20 mg/dL (ref 8–27)
CO2: 27 mmol/L (ref 20–29)
CREATININE: 0.91 mg/dL (ref 0.57–1.00)
Calcium: 9.5 mg/dL (ref 8.7–10.3)
Chloride: 102 mmol/L (ref 96–106)
GFR calc Af Amer: 77 mL/min/{1.73_m2} (ref >59)
GFR, EST NON AFRICAN AMERICAN: 67 mL/min/{1.73_m2} (ref >59)
Glucose: 93 mg/dL (ref 65–99)
Potassium: 4.7 mmol/L (ref 3.5–5.2)
SODIUM: 144 mmol/L (ref 134–144)

## 2016-07-14 LAB — LIPID PANEL
Chol/HDL Ratio: 2.9 ratio (ref 0.0–4.4)
Cholesterol, Total: 213 mg/dL — ABNORMAL HIGH (ref 100–199)
HDL: 74 mg/dL (ref >39)
LDL CALC: 119 mg/dL — AB (ref 0–99)
TRIGLYCERIDES: 101 mg/dL (ref 0–149)
VLDL CHOLESTEROL CAL: 20 mg/dL (ref 5–40)

## 2016-07-14 LAB — HEPATIC FUNCTION PANEL
ALBUMIN: 4.5 g/dL (ref 3.6–4.8)
ALK PHOS: 133 IU/L — AB (ref 39–117)
ALT: 25 IU/L (ref 0–32)
AST: 35 IU/L (ref 0–40)
Bilirubin Total: 0.8 mg/dL (ref 0.0–1.2)
Bilirubin, Direct: 0.19 mg/dL (ref 0.00–0.40)
TOTAL PROTEIN: 6.8 g/dL (ref 6.0–8.5)

## 2016-07-14 LAB — CBC
HEMOGLOBIN: 13.9 g/dL (ref 11.1–15.9)
Hematocrit: 43.8 % (ref 34.0–46.6)
MCH: 30.8 pg (ref 26.6–33.0)
MCHC: 31.7 g/dL (ref 31.5–35.7)
MCV: 97 fL (ref 79–97)
PLATELETS: 124 10*3/uL — AB (ref 150–379)
RBC: 4.51 x10E6/uL (ref 3.77–5.28)
RDW: 14 % (ref 12.3–15.4)
WBC: 5.2 10*3/uL (ref 3.4–10.8)

## 2016-07-14 LAB — PLEASE NOTE

## 2016-07-14 LAB — TSH: TSH: 2.9 u[IU]/mL (ref 0.450–4.500)

## 2016-07-14 LAB — T4, FREE: Free T4: 1.13 ng/dL (ref 0.82–1.77)

## 2016-08-01 ENCOUNTER — Telehealth: Payer: Self-pay | Admitting: Nurse Practitioner

## 2016-08-01 NOTE — Telephone Encounter (Signed)
Patient called asking to have her lab work mailed to her. Labs have been printed and mailed to patient.

## 2016-08-03 ENCOUNTER — Encounter: Payer: BLUE CROSS/BLUE SHIELD | Admitting: Internal Medicine

## 2016-10-12 ENCOUNTER — Ambulatory Visit: Payer: BLUE CROSS/BLUE SHIELD

## 2016-11-09 ENCOUNTER — Ambulatory Visit (INDEPENDENT_AMBULATORY_CARE_PROVIDER_SITE_OTHER): Payer: Medicare Other | Admitting: General Practice

## 2016-11-09 DIAGNOSIS — Z23 Encounter for immunization: Secondary | ICD-10-CM | POA: Diagnosis not present

## 2016-11-30 DIAGNOSIS — Z681 Body mass index (BMI) 19 or less, adult: Secondary | ICD-10-CM | POA: Diagnosis not present

## 2016-11-30 DIAGNOSIS — Z01419 Encounter for gynecological examination (general) (routine) without abnormal findings: Secondary | ICD-10-CM | POA: Diagnosis not present

## 2016-12-29 DIAGNOSIS — M8588 Other specified disorders of bone density and structure, other site: Secondary | ICD-10-CM | POA: Diagnosis not present

## 2016-12-29 DIAGNOSIS — N958 Other specified menopausal and perimenopausal disorders: Secondary | ICD-10-CM | POA: Diagnosis not present

## 2017-01-31 ENCOUNTER — Other Ambulatory Visit: Payer: Self-pay | Admitting: Internal Medicine

## 2017-01-31 DIAGNOSIS — Z1231 Encounter for screening mammogram for malignant neoplasm of breast: Secondary | ICD-10-CM

## 2017-02-22 DIAGNOSIS — M859 Disorder of bone density and structure, unspecified: Secondary | ICD-10-CM | POA: Diagnosis not present

## 2017-03-05 ENCOUNTER — Encounter: Payer: Self-pay | Admitting: Internal Medicine

## 2017-03-05 NOTE — Progress Notes (Signed)
Received labs from 02/22/2017, checked by Dr. Marylynn Pearson: TSH 2.9, normal Vitamin D 41, normal

## 2017-03-06 ENCOUNTER — Ambulatory Visit
Admission: RE | Admit: 2017-03-06 | Discharge: 2017-03-06 | Disposition: A | Discharge status: Home / Self Care | Payer: Medicare Other | Source: Ambulatory Visit | Attending: Internal Medicine | Admitting: Internal Medicine

## 2017-03-06 DIAGNOSIS — Z1231 Encounter for screening mammogram for malignant neoplasm of breast: Secondary | ICD-10-CM

## 2017-03-14 ENCOUNTER — Telehealth: Payer: Self-pay | Admitting: Internal Medicine

## 2017-03-14 NOTE — Telephone Encounter (Signed)
Spoke with Mrs. Patricia Dillon regarding a wellness visit, but  patient is eligible for a [employer] Visit. Last physical completed on 06/26/16.  Patient stated she will give office a call back to schedule her an appointment. SF

## 2017-04-24 ENCOUNTER — Ambulatory Visit (INDEPENDENT_AMBULATORY_CARE_PROVIDER_SITE_OTHER): Payer: Medicare Other | Admitting: Internal Medicine

## 2017-04-24 ENCOUNTER — Encounter: Payer: Self-pay | Admitting: Internal Medicine

## 2017-04-24 VITALS — BP 122/76 | HR 61 | Ht 68.0 in | Wt 120.8 lb

## 2017-04-24 DIAGNOSIS — E041 Nontoxic single thyroid nodule: Secondary | ICD-10-CM | POA: Diagnosis not present

## 2017-04-24 NOTE — Patient Instructions (Addendum)
Please call me to order a new thyroid U/S ~1 month before our next appt.  Please have a TSH checked yearly.  Try to get 1000-1200 mg calcium daily, ideally from food.  Please return in 2 years.

## 2017-04-24 NOTE — Progress Notes (Signed)
Patient ID: Patricia Dillon, female   DOB: 1950/01/15, 67 y.o.   MRN: [id number]   HPI  Patricia Dillon is a 67 y.o.-year-old female, initially referred by her PCP, Dr. Doug Sou, for management of thyroid nodule. She moved from [place] in 10/2013. Last visit 2 years ago.  She has a history of a thyroid nodule that was incidentally found on MRI of the neck in 2010.  It was followed by serial ultrasounds:  Thyroid U/S's: 09/30/2008: 0.7 x 0.4 x 0.4 cm 05/07/2009: 0.56 x 0.36 x 0.47 cm 07/12/2010: 0.63 x 0.37 x 0.39 cm 10/27/2012: 0.72 x 0.55 x 0.71 cm - 2 hyperechoic areas that appear as microcalcifications. Thyroid gland was homogeneous on U/S.    05/10/2015: Thyroid ultrasound:  Right thyroid lobe: 40 x 13 x 14 mm.  - 9 x 7 x 8 mm solid nodule with coarse calcification, inferior pole (previously 7 x 6 x 7).  - 3 mm hypoechoic nodule, superior pole.  Left thyroid lobe: 42 x 9 x 11 mm.  - Two small colloid nodules less than 4 mm.  Isthmus Thickness: 1.4 mm. No nodules visualized.  Lymphadenopathy: None visualized.  IMPRESSION: 1. Small bilateral nodules as above. Findings do not meet current consensus criteria for biopsy. Follow-up by clinical exam is recommended.    Reviewed images >> nodule appears isoechoic, and the "coarse calcification" appears to be a colloid granules with comet tail sign (benign finding).  Since the nodule appeared to be low risk for cancer, we did not biopsy it.  I reviewed pt's thyroid tests: Received labs from 02/22/2017, checked by Dr. Marylynn Pearson: TSH 2.9 Lab Results  Component Value Date   TSH 2.900 07/11/2016   TSH 1.18 06/24/2015   TSH 1.300 04/09/2014   FREET4 1.13 07/11/2016  08/16/2012: TSH 2.480, fT4 1.23   Pt denies: - feeling nodules in neck - hoarseness - dysphagia - choking - SOB with lying down  Pt does not have a FH of thyroid ds. No FH of thyroid cancer. No h/o radiation tx to head or neck.  No seaweed or kelp. No recent  contrast studies. No herbal supplements. No Biotin use. No recent steroids use.   She also has a history of Mitral regurgitation. She also has osteopenia. Continues vitamin D.  ROS: Constitutional: no weight gain/no weight loss, no fatigue, no subjective hyperthermia, no subjective hypothermia Eyes: no blurry vision, no xerophthalmia ENT: no sore throat, + see HPI Cardiovascular: no CP/no SOB/+ palpitations/no leg swelling Respiratory: no cough/no SOB/no wheezing Gastrointestinal: no N/no V/no D/no C/no acid reflux Musculoskeletal: no muscle aches/no joint aches Skin: no rashes, no hair loss Neurological: no tremors/no numbness/no tingling/no dizziness  I reviewed pt's medications, allergies, PMH, social hx, family hx, and changes were documented in the history of present illness. Otherwise, unchanged from my initial visit note.  Past Medical History:  Diagnosis Date  . Hyperlipidemia   . Lichen sclerosus   . Mitral valve regurgitation    Past Surgical History:  Procedure Laterality Date  . BREAST BIOPSY Left    benign   History   Social History  . Marital Status: Divorced    Spouse Name: N/A  . Number of Children: 2   Occupational History  . Scientist, water quality.   Social History Main Topics  . Smoking status: Never Smoker   . Smokeless tobacco: Not on file  . Alcohol Use: No  . Drug Use: No   Current Outpatient Medications on File Prior to Visit  Medication Sig Dispense Refill  . Cholecalciferol (VITAMIN D) 2000 UNITS tablet Take 1,000 Units by mouth daily.     . Cholecalciferol 10000 units TABS Take 1,000 Units by mouth daily.    Marland Kitchen PRAMOSONE E 1-2.5 % CREA Apply 1 - 2 times daily as needed.  2   No current facility-administered medications on file prior to visit.    Allergies  Allergen Reactions  . Penicillins   . Vancomycin    Family History  Problem Relation Age of Onset  . Hypertension Mother   . Heart disease Father    PE: There were no vitals taken for this  visit. There is no height or weight on file to calculate BMI. Wt Readings from Last 3 Encounters:  06/26/16 116 lb (52.6 kg)  06/24/15 122 lb 12.8 oz (55.7 kg)  05/06/15 122 lb 9.6 oz (55.6 kg)   Constitutional: normal weight, in NAD Eyes: PERRLA, EOMI, no exophthalmos ENT: moist mucous membranes, no thyromegaly, no cervical lymphadenopathy Cardiovascular: RRR, No MRG Respiratory: CTA B Gastrointestinal: abdomen soft, NT, ND, BS+ Musculoskeletal: no deformities, strength intact in all 4 Skin: moist, warm, no rashes Neurological: no tremor with outstretched hands, DTR normal in all 4  ASSESSMENT: 1. R Thyroid nodule - small  PLAN: 1.  -I reviewed the images of her 2014 and 2017 thyroid ultrasound along with the patient.  The right thyroid nodule is very small, without internal blood flow, more white than tall, and well the limited from surrounding tissue.  It is isoechoic/mildly hypoechoic and has 2 colloid granules, rather than calcifications as initially characterized on the report.  This is a benign finding.  In this context, and also as she does not have a thyroid cancer family history or personal history of radiation therapy to head or neck, and also as the nodule was stable in size over 8 years, from 2010-2017, I do not feel that any other intervention is needed for now.  Patient agrees. - We decided to repeat another thyroid ultrasound in 2 years from now, for years from the previous ultrasound and decide at that time whether an FNA is needed.  At the current size, the nodule is too small for an FNA. - I again emphasized that she needs to let me know if she develops any neck compression symptoms - I will see her back in 2 years - Continue to get annual TSH levels with PCP - I advised her to join my chart to be able to communicate through the patient portal  - time spent with the patient: 15 min, of which >50% was spent in obtaining information about her symptoms, reviewing her  previous labs, U/S images, evaluations, and treatments, counseling her about her condition (please see the discussed topics above), and developing a plan to further investigate ir.   Philemon Kingdom, MD PhD [facility]

## 2017-04-25 DIAGNOSIS — L853 Xerosis cutis: Secondary | ICD-10-CM | POA: Diagnosis not present

## 2017-04-25 DIAGNOSIS — L900 Lichen sclerosus et atrophicus: Secondary | ICD-10-CM | POA: Diagnosis not present

## 2017-05-01 DIAGNOSIS — H2513 Age-related nuclear cataract, bilateral: Secondary | ICD-10-CM | POA: Diagnosis not present

## 2017-05-01 DIAGNOSIS — H43813 Vitreous degeneration, bilateral: Secondary | ICD-10-CM | POA: Diagnosis not present

## 2017-05-01 DIAGNOSIS — H5213 Myopia, bilateral: Secondary | ICD-10-CM | POA: Diagnosis not present

## 2017-05-01 DIAGNOSIS — H31003 Unspecified chorioretinal scars, bilateral: Secondary | ICD-10-CM | POA: Diagnosis not present

## 2017-06-13 DIAGNOSIS — L84 Corns and callosities: Secondary | ICD-10-CM | POA: Diagnosis not present

## 2017-06-13 DIAGNOSIS — L900 Lichen sclerosus et atrophicus: Secondary | ICD-10-CM | POA: Diagnosis not present

## 2017-07-12 ENCOUNTER — Ambulatory Visit (INDEPENDENT_AMBULATORY_CARE_PROVIDER_SITE_OTHER): Payer: Medicare Other | Admitting: Internal Medicine

## 2017-07-12 ENCOUNTER — Encounter: Payer: Self-pay | Admitting: Internal Medicine

## 2017-07-12 DIAGNOSIS — J069 Acute upper respiratory infection, unspecified: Secondary | ICD-10-CM

## 2017-07-12 NOTE — Progress Notes (Signed)
   Subjective:    Patient ID: Patricia Dillon, female    DOB: 1950-06-14, 67 y.o.   MRN: [id number]  HPI The patient is a 67 YO female coming in for swollen glands right neck as well as tooth pain global and right ear pain on the external ear. She denies fevers or chills. Some mild sinus drainage which is improving. The swelling is almost gone now and improving. The pain in her teeth is gradually improving but still present. No sinus pain. Is not taking anything for this. 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 She denies worsening. She denies taking anything for it. No cough or SOB.   Review of Systems  Constitutional: Negative for activity change, appetite change, chills, fatigue, fever and unexpected weight change.  HENT: Positive for congestion, dental problem, ear pain and rhinorrhea. Negative for ear discharge, hearing loss, mouth sores, postnasal drip, sinus pressure, sinus pain, sneezing, sore throat, tinnitus, trouble swallowing and voice change.   Eyes: Negative.   Respiratory: Negative for cough, chest tightness, shortness of breath and wheezing.   Cardiovascular: Negative.   Gastrointestinal: Negative.   Musculoskeletal: Negative.   Neurological: Negative.       Objective:   Physical Exam  Constitutional: She is oriented to person, place, and time. She appears well-developed and well-nourished.  HENT:  Head: Normocephalic and atraumatic.  Ears are normal and no tenderness to palpation, TMs normal bilaterally, oropharynx with redness and clear drainage, no tooth or dental infection or redness  Eyes: EOM are normal.  Neck: Normal range of motion.  Minimal shotty LAD right maxillary  Cardiovascular: Normal rate and regular rhythm.  Pulmonary/Chest: Effort normal and breath sounds normal. No respiratory distress. She has no wheezes. She has no rales.  Abdominal: Soft. Bowel sounds are normal. She exhibits no distension. There is no tenderness. There is no rebound.  Musculoskeletal: She  exhibits no edema.  Neurological: She is alert and oriented to person, place, and time. Coordination normal.  Skin: Skin is warm and dry.  Psychiatric: She has a normal mood and affect.   Vitals:   07/12/17 1609  BP: 120/80  Pulse: 67  Temp: 98.8 F (37.1 C)  TempSrc: Oral  SpO2: 97%  Weight: 116 lb (52.6 kg)  Height: 5\' 8"  (1.727 m)      Assessment & Plan:

## 2017-07-12 NOTE — Patient Instructions (Signed)
You are fine to do allergy medicine over the counter if you want.   Let us know if the ear symptoms change.

## 2017-07-13 DIAGNOSIS — J069 Acute upper respiratory infection, unspecified: Secondary | ICD-10-CM | POA: Insufficient documentation

## 2017-07-13 NOTE — Assessment & Plan Note (Signed)
Resolving and no antibiotics or steroids are indicated. Advised to take zyrtec daily for sinuses but she declines.

## 2017-08-23 DIAGNOSIS — M216X9 Other acquired deformities of unspecified foot: Secondary | ICD-10-CM | POA: Diagnosis not present

## 2017-08-23 DIAGNOSIS — M71572 Other bursitis, not elsewhere classified, left ankle and foot: Secondary | ICD-10-CM | POA: Diagnosis not present

## 2017-08-23 DIAGNOSIS — M21621 Bunionette of right foot: Secondary | ICD-10-CM | POA: Diagnosis not present

## 2017-08-23 DIAGNOSIS — M71571 Other bursitis, not elsewhere classified, right ankle and foot: Secondary | ICD-10-CM | POA: Diagnosis not present

## 2017-10-08 ENCOUNTER — Ambulatory Visit: Payer: Medicare Other

## 2017-10-12 ENCOUNTER — Ambulatory Visit (INDEPENDENT_AMBULATORY_CARE_PROVIDER_SITE_OTHER): Payer: Medicare Other

## 2017-10-12 DIAGNOSIS — Z23 Encounter for immunization: Secondary | ICD-10-CM

## 2017-12-17 DIAGNOSIS — Z124 Encounter for screening for malignant neoplasm of cervix: Secondary | ICD-10-CM | POA: Diagnosis not present

## 2017-12-17 DIAGNOSIS — Z681 Body mass index (BMI) 19 or less, adult: Secondary | ICD-10-CM | POA: Diagnosis not present

## 2017-12-21 DIAGNOSIS — D2271 Melanocytic nevi of right lower limb, including hip: Secondary | ICD-10-CM | POA: Diagnosis not present

## 2017-12-21 DIAGNOSIS — L300 Nummular dermatitis: Secondary | ICD-10-CM | POA: Diagnosis not present

## 2017-12-21 DIAGNOSIS — D2272 Melanocytic nevi of left lower limb, including hip: Secondary | ICD-10-CM | POA: Diagnosis not present

## 2017-12-21 DIAGNOSIS — D2262 Melanocytic nevi of left upper limb, including shoulder: Secondary | ICD-10-CM | POA: Diagnosis not present

## 2017-12-21 DIAGNOSIS — L900 Lichen sclerosus et atrophicus: Secondary | ICD-10-CM | POA: Diagnosis not present

## 2017-12-21 DIAGNOSIS — D225 Melanocytic nevi of trunk: Secondary | ICD-10-CM | POA: Diagnosis not present

## 2017-12-21 DIAGNOSIS — D1801 Hemangioma of skin and subcutaneous tissue: Secondary | ICD-10-CM | POA: Diagnosis not present

## 2017-12-21 DIAGNOSIS — L814 Other melanin hyperpigmentation: Secondary | ICD-10-CM | POA: Diagnosis not present

## 2017-12-21 DIAGNOSIS — L821 Other seborrheic keratosis: Secondary | ICD-10-CM | POA: Diagnosis not present

## 2018-01-28 ENCOUNTER — Other Ambulatory Visit: Payer: Self-pay | Admitting: Internal Medicine

## 2018-01-28 DIAGNOSIS — Z1231 Encounter for screening mammogram for malignant neoplasm of breast: Secondary | ICD-10-CM

## 2018-02-28 DIAGNOSIS — L600 Ingrowing nail: Secondary | ICD-10-CM | POA: Diagnosis not present

## 2018-03-18 ENCOUNTER — Ambulatory Visit: Payer: Medicare Other

## 2018-03-22 ENCOUNTER — Ambulatory Visit: Payer: Self-pay | Admitting: *Deleted

## 2018-03-22 ENCOUNTER — Other Ambulatory Visit: Payer: Self-pay

## 2018-03-22 ENCOUNTER — Encounter (HOSPITAL_COMMUNITY): Payer: Self-pay | Admitting: Pharmacy Technician

## 2018-03-22 ENCOUNTER — Emergency Department (HOSPITAL_COMMUNITY)
Admission: EM | Admit: 2018-03-22 | Discharge: 2018-03-22 | Disposition: A | Discharge status: Home / Self Care | Payer: Medicare Other | Attending: Emergency Medicine | Admitting: Emergency Medicine

## 2018-03-22 DIAGNOSIS — J069 Acute upper respiratory infection, unspecified: Secondary | ICD-10-CM | POA: Diagnosis not present

## 2018-03-22 DIAGNOSIS — J029 Acute pharyngitis, unspecified: Secondary | ICD-10-CM | POA: Diagnosis not present

## 2018-03-22 DIAGNOSIS — Z79899 Other long term (current) drug therapy: Secondary | ICD-10-CM | POA: Diagnosis not present

## 2018-03-22 DIAGNOSIS — R002 Palpitations: Secondary | ICD-10-CM | POA: Diagnosis not present

## 2018-03-22 DIAGNOSIS — R05 Cough: Secondary | ICD-10-CM | POA: Diagnosis present

## 2018-03-22 LAB — BASIC METABOLIC PANEL
Anion gap: 8 (ref 5–15)
BUN: 19 mg/dL (ref 8–23)
CALCIUM: 9.5 mg/dL (ref 8.9–10.3)
CHLORIDE: 104 mmol/L (ref 98–111)
CO2: 25 mmol/L (ref 22–32)
CREATININE: 1.02 mg/dL — AB (ref 0.44–1.00)
GFR calc Af Amer: >60 mL/min (ref >60)
GFR calc non Af Amer: 57 mL/min — ABNORMAL LOW (ref >60)
Glucose, Bld: 110 mg/dL — ABNORMAL HIGH (ref 70–99)
Potassium: 4.1 mmol/L (ref 3.5–5.1)
SODIUM: 137 mmol/L (ref 135–145)

## 2018-03-22 LAB — CBC WITH DIFFERENTIAL/PLATELET
Abs Immature Granulocytes: 0.02 10*3/uL (ref 0.00–0.07)
BASOS PCT: 0 %
Basophils Absolute: 0 10*3/uL (ref 0.0–0.1)
EOS ABS: 0.1 10*3/uL (ref 0.0–0.5)
EOS PCT: 1 %
HCT: 43.5 % (ref 36.0–46.0)
Hemoglobin: 13.6 g/dL (ref 12.0–15.0)
Immature Granulocytes: 0 %
Lymphocytes Relative: 9 %
Lymphs Abs: 1 10*3/uL (ref 0.7–4.0)
MCH: 30.2 pg (ref 26.0–34.0)
MCHC: 31.3 g/dL (ref 30.0–36.0)
MCV: 96.7 fL (ref 80.0–100.0)
MONO ABS: 0.5 10*3/uL (ref 0.1–1.0)
Monocytes Relative: 5 %
Neutro Abs: 8.8 10*3/uL — ABNORMAL HIGH (ref 1.7–7.7)
Neutrophils Relative %: 85 %
Platelets: 140 10*3/uL — ABNORMAL LOW (ref 150–400)
RBC: 4.5 MIL/uL (ref 3.87–5.11)
RDW: 12.5 % (ref 11.5–15.5)
WBC: 10.4 10*3/uL (ref 4.0–10.5)
nRBC: 0 % (ref 0.0–0.2)

## 2018-03-22 LAB — MAGNESIUM: MAGNESIUM: 2.3 mg/dL (ref 1.7–2.4)

## 2018-03-22 NOTE — ED Notes (Signed)
Patient verbalizes understanding of discharge instructions. Opportunity for questioning and answers were provided. 

## 2018-03-22 NOTE — Telephone Encounter (Signed)
Pt calling with concerns of having COVID-19 or possible strep throat with symptoms of sore throat and a temp on 99.5. Pt starts that her sore throat developed on Monday but she has been in the home and denies any recent travel to [place] exposure areas or around someone who was positive for COVID-19. Pt states that she does take care of her grandkids during the day but denies any known exposure to someone with strep throat.Pt states she had strep throat in the past and her tongue and throat feels rough. Pt denies any difficulty breathing at this time. Pt given home care advice and advised that she would need to be seen in the next 3 days for current symptoms. Pt states she will try to go to Urgent Care over the weekend but if she is not able to she will return call to office to schedule appt.   Reason for Disposition . [1] Sore throat is the only symptom AND [2] present > 48 hours . [1] No COVID-19 EXPOSURE BUT [2] questions about  Answer Assessment - Initial Assessment Questions 1. ONSET: "When did the throat start hurting?" (Hours or days ago)       2. SEVERITY: "How bad is the sore throat?" (Scale 1-10; mild, moderate or severe)   - MILD (1-3):  doesn't interfere with eating or normal activities   - MODERATE (4-7): interferes with eating some solids and normal activities   - SEVERE (8-10):  excruciating pain, interferes with most normal activities   - SEVERE DYSPHAGIA: can't swallow liquids, drooling    Moderate not really painful 3. STREP EXPOSURE: "Has there been any exposure to strep within the past week?" If so, ask: "What type of contact occurred?"      No 4.  VIRAL SYMPTOMS: "Are there any symptoms of a cold, such as a runny nose, cough, hoarse voice or red eyes?"     Cough with slight mucus runny nose two weeks ago 5. FEVER: "Do you have a fever?" If so, ask: "What is your temperature, how was it measured, and when did it start?"     99.5 6. PUS ON THE TONSILS: "Is there pus on the  tonsils in the back of your throat?"     No tonsils 7. OTHER SYMPTOMS: "Do you have any other symptoms?" (e.g., difficulty breathing, headache, rash)     No 8. PREGNANCY: "Is there any chance you are pregnant?" "When was your last menstrual period?"     n/a  Answer Assessment - Initial Assessment Questions 1. CONFIRMED CASE: "Who is the person with the confirmed COVID-19 infection that you were exposed to?"     N/a 2. PLACE of CONTACT: "Where were you when you were exposed to COVID-19  (coronavirus disease 2019)?" (e.g., city, state, country)     n/a 3. TYPE of CONTACT: "How much contact was there?" (e.g., live in same house, work in same office, same school)     n/a 4. DATE of CONTACT: "When did you have contact with a coronavirus patient?" (e.g., days)     n/a 5. DURATION of CONTACT: "How long were you in contact with the COVID-19 (coronavirus disease) patient?" (e.g., a few seconds, passed by person, a few minutes, live with the patient)     n/a 6. SYMPTOMS: "Do you have any symptoms?" (e.g., fever, cough, breathing difficulty)     Sore throat, fever of 99.5      7. HIGH RISK: "Do you have any heart or lung problems?  Do you have a weakened immune system?" (e.g., CHF, COPD, asthma, HIV positive, chemotherapy, renal failure, diabetes mellitus, sickle cell anemia)     Pt states she does have a history of mitral valve regurgitation and has been experiencing increased palpitations due to stress  Protocols used: [place], CORONAVIRUS (COVID-19) EXPOSURE-A-AH

## 2018-03-22 NOTE — ED Notes (Signed)
Nurse collecting labs. 

## 2018-03-22 NOTE — ED Provider Notes (Signed)
[facility] EMERGENCY DEPARTMENT Provider Note   CSN: [id number] Arrival date & time: 03/22/18  2050    History   Chief Complaint Chief Complaint  Patient presents with  . URI    HPI Patricia Dillon is a 68 y.o. female.     Patient with history of mitral valve regurgitation, hyperlipidemia presents for cough congestion and sore throat worsening the past few days.  Patient was seen in urgent care and was sent over after negative strep test for work-up for palpitations.  Patient's palpitations have resolved and she feels may be related to not keeping up with oral fluids or more likely stress of Covid.  Currently no symptoms.  No chest pain or shortness of breath, no recent surgery, no cardiac or blood clot history.  Patient wore Holter monitor years ago.  No weight loss, patient has thyroid nodule history.  No leg swelling.     Past Medical History:  Diagnosis Date  . Hyperlipidemia   . Lichen sclerosus   . Mitral valve regurgitation   . Osteopenia     Patient Active Problem List   Diagnosis Date Noted  . Viral URI 07/13/2017  . 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 Mitral valve regurgitation 06/26/2016  . Routine general medical examination at a health care facility 06/24/2015  . Thyroid nodule 04/09/2014  . Lichen sclerosus 07/02/1599  . Hyperlipidemia 12/30/2013  . Hx of cyst of breast 12/30/2013    Past Surgical History:  Procedure Laterality Date  . BREAST BIOPSY Left    benign     OB History   No obstetric history on file.      Home Medications    Prior to Admission medications   Medication Sig Start Date End Date Taking? Authorizing Provider  cholecalciferol (VITAMIN D) 1000 units tablet Take 1,000 Units by mouth daily.   Yes [provider]    Family History Family History  Problem Relation Age of Onset  . Hypertension Mother   . Heart disease Father     Social History Social History   Tobacco Use  . Smoking status:  Never Smoker  . Smokeless tobacco: Never Used  Substance Use Topics  . Alcohol use: No    Alcohol/week: 0.0 standard drinks  . Drug use: No     Allergies   Vancomycin and Penicillins   Review of Systems Review of Systems  Constitutional: Negative for chills and fever (99.5).  HENT: Positive for congestion.   Eyes: Negative for visual disturbance.  Respiratory: Positive for cough. Negative for shortness of breath.   Cardiovascular: Positive for palpitations. Negative for chest pain.  Gastrointestinal: Negative for abdominal pain and vomiting.  Genitourinary: Negative for dysuria and flank pain.  Musculoskeletal: Negative for back pain, neck pain and neck stiffness.  Skin: Negative for rash.  Neurological: Negative for light-headedness and headaches.     Physical Exam Updated Vital Signs BP 133/70   Pulse 67   Temp 99.2 F (37.3 C) (Oral)   Resp 17   Ht 5\' 8"  (1.727 m)   Wt 53.1 kg   SpO2 97%   BMI 17.79 kg/m   Physical Exam Vitals signs and nursing note reviewed.  Constitutional:      Appearance: She is well-developed.  HENT:     Head: Normocephalic and atraumatic.     Nose: Congestion present.  Eyes:     General:        Right eye: No discharge.  Left eye: No discharge.     Conjunctiva/sclera: Conjunctivae normal.  Neck:     Musculoskeletal: Normal range of motion and neck supple.     Trachea: No tracheal deviation.  Cardiovascular:     Rate and Rhythm: Normal rate and regular rhythm.  Pulmonary:     Effort: Pulmonary effort is normal.     Breath sounds: Normal breath sounds.  Abdominal:     General: There is no distension.     Palpations: Abdomen is soft.     Tenderness: There is no abdominal tenderness. There is no guarding.  Skin:    General: Skin is warm.     Findings: No rash.  Neurological:     Mental Status: She is alert and oriented to person, place, and time.      ED Treatments / Results  Labs (all labs ordered are listed, but  only abnormal results are displayed) Labs Reviewed  CBC WITH DIFFERENTIAL/PLATELET - Abnormal; Notable for the following components:      Result Value   Platelets 140 (*)    Neutro Abs 8.8 (*)    All other components within normal limits  BASIC METABOLIC PANEL - Abnormal; Notable for the following components:   Glucose, Bld 110 (*)    Creatinine, Ser 1.02 (*)    GFR calc non Af Amer 57 (*)    All other components within normal limits  MAGNESIUM    EKG EKG Interpretation  Date/Time:  Friday March 22 2018 21:00:07 EDT Ventricular Rate:  83 PR Interval:    QRS Duration: 96 QT Interval:  366 QTC Calculation: 430 R Axis:   89 Text Interpretation:  Sinus rhythm Multiple premature complexes, vent & supraven Consider left atrial enlargement Borderline right axis deviation Minimal ST depression, inferior leads Confirmed by Elnora Morrison [phone]) on 03/22/2018 9:56:40 PM   Radiology No results found.  Procedures Procedures (including critical care time)  Medications Ordered in ED Medications - No data to display   Initial Impression / Assessment and Plan / ED Course  I have reviewed the triage vital signs and the nursing notes.  Pertinent labs & imaging results that were available during my care of the patient were reviewed by me and considered in my medical decision making (see chart for details).       Patient well-appearing in the ER no symptoms.  With recent palpitations screening blood work done hemoglobin normal, potassium normal, kidney function minimally elevated.  Patient stable for close outpatient follow-up for further work-up. EKG reviewed nonspecific finding, poor baseline.  Patient has no chest pain or shortness of breath. Results and differential diagnosis were discussed with the patient/parent/guardian. Xrays were independently reviewed by myself.  Close follow up outpatient was discussed, comfortable with the plan.   Medications - No data to display   Vitals:   03/22/18 2100 03/22/18 2115 03/22/18 2130 03/22/18 2145  BP: (!) 160/88 (!) 143/76 (!) 144/75 133/70  Pulse: 85 74 73 67  Resp: 13 18 18 17   Temp:      TempSrc:      SpO2: 98% 96% 96% 97%  Weight:      Height:        Final diagnoses:  Palpitations  Upper respiratory infection, acute     Final Clinical Impressions(s) / ED Diagnoses   Final diagnoses:  Palpitations  Upper respiratory infection, acute    ED Discharge Orders    None       Elnora Morrison, MD 03/22/18 2320

## 2018-03-22 NOTE — ED Triage Notes (Signed)
Pt arrives pov with sore throat, URI and palpitations. Seen at [facility] and strep has been ruled out.

## 2018-03-22 NOTE — Discharge Instructions (Addendum)
Stay well-hydrated and follow-up with primary care doctor and possibly cardiology to set up a Holter monitor.  See a clinician if you develop chest pain, shortness of breath, recurrent fevers or new concerns.  Take Tylenol every 4 hours as needed for aches/pain/fevers.

## 2018-03-25 ENCOUNTER — Telehealth (INDEPENDENT_AMBULATORY_CARE_PROVIDER_SITE_OTHER): Payer: Medicare Other | Admitting: Internal Medicine

## 2018-03-25 ENCOUNTER — Encounter: Payer: Self-pay | Admitting: Internal Medicine

## 2018-03-25 ENCOUNTER — Telehealth: Payer: Self-pay | Admitting: Internal Medicine

## 2018-03-25 DIAGNOSIS — J069 Acute upper respiratory infection, unspecified: Secondary | ICD-10-CM

## 2018-03-25 DIAGNOSIS — I498 Other specified cardiac arrhythmias: Secondary | ICD-10-CM

## 2018-03-25 DIAGNOSIS — E041 Nontoxic single thyroid nodule: Secondary | ICD-10-CM

## 2018-03-25 NOTE — Telephone Encounter (Signed)
Patient called 03/24/18 [facility].  Stated she had sore throat, no fever, diarrhea and heart palpations.  States she was seen at urgent care on the 20th and her BP was 160/89.  She was sent to the ER at that time for EKG.  She has not checked her BP this date.  She has had a recent strep test negative.  Wanted to be tested for COVID 19.  Looks like patient went to the ED.

## 2018-03-25 NOTE — Telephone Encounter (Signed)
Virtual video set up for 3pm and patient has webex downloaded on computer

## 2018-03-25 NOTE — Assessment & Plan Note (Signed)
Needs TSH and free T4 at follow up visit.

## 2018-03-25 NOTE — Progress Notes (Signed)
Virtual Visit via Video Note  I connected with Patricia Dillon on 03/25/18 at  3:00 PM EDT by a video enabled telemedicine application and verified that I am speaking with the correct person using two identifiers.   I discussed the limitations of evaluation and management by telemedicine and the availability of in person appointments. The patient expressed understanding and agreed to proceed.  History of Present Illness: The patient is a 68 y.o. YO female with visit for ER follow up of URI symptoms and palpitations 3 days ago. She had rapid test for strep which was negative. She has labs of CBC, CMP, magnesium without acute findings. 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 Past history of palpitations with caffeine and sometimes positional. Has done holter monitor but at least 10 years. Started on the 16th with sore throat and intermittent fevers. Denies chills or SOB. Denies significant cough. Denies chest pains or tightness. She has been hydrating better and turned off the news. The palpitations make her anxious and the news makes her anxious. Has questions about getting tested for coronavirus as this was not done. Has had improvement in palpitations by about 50% since Friday. Denies GI symptoms. Overall it is mildly improving. Has tried nothing.    Observations/Objective: Appearance: normal, breathing appears normal without evidence of distress, good grooming, abdomen does not appear distended, throat not examined, memory normal, mental status is  normal   Assessment and Plan: See problem oriented charting  Follow Up Instructions: Follow up in 2-3 months for follow up and labs, call back soon if palpitations worse or chest pain presents    I discussed the assessment and treatment plan with the patient. The patient was provided an opportunity to ask questions and all were answered. The patient agreed with the plan and demonstrated an understanding of the instructions.    The patient was advised to call back or seek an in-person evaluation if the symptoms worsen or if the condition fails to improve as anticipated.  I provided 25 minutes of non-face-to-face time during this encounter.   Hoyt Koch, MD

## 2018-03-25 NOTE — Assessment & Plan Note (Signed)
She requests testing for coronavirus which we do not have currently due to conservation of PPE. She is advised to take zyrtec or claritin if able to help with sore throat. Strep testing negative at urgent care. No antibiotics are indicated at this time.

## 2018-03-25 NOTE — Telephone Encounter (Signed)
Patient states palpitations get exaggerated with stress and her BP has been elevated as well as pulse. Dr. From the ED states to follow up with PCP to discuss halter monitor to monitor the palpitations. Patient informed that there is not testing for the the Covid-19 patient states that she is not having any SOB or trouble breathing has had a cough for two weeks, sore throat and low grade fevers no higher than 99.2. seems interested in doing a virtual video

## 2018-03-25 NOTE — Assessment & Plan Note (Signed)
Prior palpitations and these sound consistent. EKG done in the ER without A fib. Will hold off on cardiology referral for now. Hold on holter monitor and can order if symptoms worsen. She is advised to keep hydrated, avoid caffeine and otc cold products.

## 2018-03-27 ENCOUNTER — Telehealth: Payer: Self-pay

## 2018-03-27 DIAGNOSIS — R002 Palpitations: Secondary | ICD-10-CM

## 2018-03-27 NOTE — Telephone Encounter (Signed)
Copied from [place] [id number]. Topic: General - Other >> Mar 27, 2018 10:43 AM Percell Belt A wrote: Reason for CRM:   Pt called in and left VM that she had a video chat this week with Dr Sharlet Salina and she stated that her papertation have increased since the video chat.  She would like to go head with a the procedure that was dicussed    [phone]

## 2018-03-27 NOTE — Telephone Encounter (Signed)
I'm assuming palpitations.

## 2018-03-28 NOTE — Telephone Encounter (Signed)
Placed order

## 2018-03-28 NOTE — Telephone Encounter (Signed)
Pt informed of below.  

## 2018-03-28 NOTE — Addendum Note (Signed)
Addended by: Pricilla Holm A on: 03/28/2018 07:28 AM   Modules accepted: Orders

## 2018-04-02 NOTE — Telephone Encounter (Signed)
New Message          Patient is needing a call back for a holter monitor placement.

## 2018-04-02 NOTE — Telephone Encounter (Signed)
Called patient and patient stated that she did not mean to call our office she was calling another office in regard to her Holter monitor. Patient has already got in contact with them and her questions were answered. Patient did not need our office

## 2018-04-05 ENCOUNTER — Ambulatory Visit: Payer: Self-pay

## 2018-04-05 NOTE — Telephone Encounter (Signed)
Patient called and says she is having a fever, making an effort to breathe and her breasts feel full/tender. She says she felt warm and her temperature was 99.3, then 99.1 while on the phone. She denies cough, but says she has to make a conscious effort to take a deep breath, which started 3 days ago. She says it doesn't keep her from sleeping, but it's constant during the day. She denies SOB resting and when she is up moving around no SOB. She says her breasts feel full/tender at a 3 pain like it is when she had a menstrual period. She denies travel or exposure to COVID-19. I advised home care advice, advised is symptoms worsen to do an e-visit on the [facility] website or go to the ED or UC over the weekend, advised I will send this to Dr. Sharlet Salina so she will be aware of the call.   Reason for Disposition . [1] Fever AND [2] no signs of serious infection or localizing symptoms (all other triage questions negative)  Answer Assessment - Initial Assessment Questions 1. RESPIRATORY STATUS: "Describe your breathing?" (e.g., wheezing, shortness of breath, unable to speak, severe coughing)      Not SOB, just have to consciously breathe deeply. 2. ONSET: "When did this breathing problem begin?"      Probably 3 days ago, been trying to relax 3. PATTERN "Does the difficult breathing come and go, or has it been constant since it started?"      Constant during the day, but not causing me to wake up 4. SEVERITY: "How bad is your breathing?" (e.g., mild, moderate, severe)    - MILD: No SOB at rest, mild SOB with walking, speaks normally in sentences, can lay down, no retractions, pulse < 100.    - MODERATE: SOB at rest, SOB with minimal exertion and prefers to sit, cannot lie down flat, speaks in phrases, mild retractions, audible wheezing, pulse 100-120.    - SEVERE: Very SOB at rest, speaks in single words, struggling to breathe, sitting hunched forward, retractions, pulse > 120      No SOB at all 5.  RECURRENT SYMPTOM: "Have you had difficulty breathing before?" If so, ask: "When was the last time?" and "What happened that time?"      Years ago when had the flu 6. CARDIAC HISTORY: "Do you have any history of heart disease?" (e.g., heart attack, angina, bypass surgery, angioplasty)     Yes, mitral valve regurgitation 7. LUNG HISTORY: "Do you have any history of lung disease?"  (e.g., pulmonary embolus, asthma, emphysema)     No 8. CAUSE: "What do you think is causing the breathing problem?"      I don't really know, I was wondering if it's pneumonia since the temp is going up 9. OTHER SYMPTOMS: "Do you have any other symptoms? (e.g., dizziness, runny nose, cough, chest pain, fever)     Low grade fever 98.7-99.3, bilateral breast tenderness/fullness 10. PREGNANCY: "Is there any chance you are pregnant?" "When was your last menstrual period?"       No 11. TRAVEL: "Have you traveled out of the country in the last month?" (e.g., travel history, exposures)       No  Answer Assessment - Initial Assessment Questions 1. TEMPERATURE: "What is the most recent temperature?"  "How was it measured?"      99.3, 99.1 2. ONSET: "When did the fever start?"      Little while ago 3. SYMPTOMS: "Do you have any  other symptoms besides the fever?"  (e.g., colds, headache, sore throat, earache, cough, rash, diarrhea, vomiting, abdominal pain)     No 4. CAUSE: If there are no symptoms, ask: "What do you think is causing the fever?"      I don't know unless it's pneumonia 5. CONTACTS: "Does anyone else in the family have an infection?"     N/A 6. TREATMENT: "What have you done so far to treat this fever?" (e.g., medications)     N/A 7. IMMUNOCOMPROMISE: "Do you have of the following: diabetes, HIV positive, splenectomy, cancer chemotherapy, chronic steroid treatment, transplant patient, etc."     N/A 8. PREGNANCY: "Is there any chance you are pregnant?" "When was your last menstrual period?"     No 9.  TRAVEL: "Have you traveled out of the country in the last month?" (e.g., travel history, exposures)     No  Protocols used: FEVER-A-AH, BREATHING DIFFICULTY-A-AH

## 2018-04-08 ENCOUNTER — Encounter: Payer: Self-pay | Admitting: Internal Medicine

## 2018-04-08 ENCOUNTER — Ambulatory Visit (INDEPENDENT_AMBULATORY_CARE_PROVIDER_SITE_OTHER): Payer: Medicare Other | Admitting: Internal Medicine

## 2018-04-08 DIAGNOSIS — J069 Acute upper respiratory infection, unspecified: Secondary | ICD-10-CM | POA: Diagnosis not present

## 2018-04-08 NOTE — Progress Notes (Signed)
Virtual Visit via Video Note  I connected with Patricia Dillon on 04/08/18 at 10:00 AM EDT by a video enabled telemedicine application and verified that I am speaking with the correct person using two identifiers.   I discussed the limitations of evaluation and management by telemedicine and the availability of in person appointments. The patient expressed understanding and agreed to proceed.  History of Present Illness: The patient is a 68 y.o. YO female with visit for breast tenderness. She had a viral illness with more palpitations about 2-3 weeks ago. Not tested for covid-19. Her cough and headaches are now gone. Still running fevers now to 99.3 on Friday. Started with the breast tenderness about 1 week ago and gets some sensation of not being able to breathe the right amount and having to take more care and focus on breathing. Sometimes feels she cannot get a full breath. Has no chills or body aches. Does have allergies and deviated septum which sometimes impairs her nasal breathing. Denies cough or SOB with walking. Overall it is stable since last week. Has tried nothing specific  Observations/Objective: Appearance: normal, breathing appears normal, normal grooming, abdomen does not appear distended, throat normal, mental status is A and O times 3  Assessment and Plan: See problem oriented charting  Follow Up Instructions: practice deep breathing exercises, can take tylenol for the breast tenderness, if still present in a few days let us know  I discussed the assessment and treatment plan with the patient. The patient was provided an opportunity to ask questions and all were answered. The patient agreed with the plan and demonstrated an understanding of the instructions.   The patient was advised to call back or seek an in-person evaluation if the symptoms worsen or if the condition fails to improve as anticipated.  Hoyt Koch, MD

## 2018-04-08 NOTE — Telephone Encounter (Signed)
Noted, will remind [facility] of [facility] Virtual Saturday clinic.

## 2018-04-08 NOTE — Telephone Encounter (Signed)
We really should have advised her to do virtual visit on Saturday. Happy to do one for her now if she desires.

## 2018-04-08 NOTE — Assessment & Plan Note (Signed)
Most symptoms are gone but she still has some breathing tightness. Advised to do deep breathing techniques. If no improvement with this may have her come to office and check breathing. She does not have current cough or fevers so does not need imaging at this time. The breast tenderness could be related to recent coughing. Advised to use tylenol if needed. If no resolution can try to get her mammogram as she is due. No lumps she is feeling. Last mammogram 03/2017 which was normal.

## 2018-04-08 NOTE — Telephone Encounter (Signed)
Virtual made

## 2018-04-15 ENCOUNTER — Ambulatory Visit: Payer: Medicare Other

## 2018-05-10 ENCOUNTER — Other Ambulatory Visit: Payer: Self-pay

## 2018-05-10 ENCOUNTER — Other Ambulatory Visit (INDEPENDENT_AMBULATORY_CARE_PROVIDER_SITE_OTHER): Payer: Medicare Other

## 2018-05-10 ENCOUNTER — Ambulatory Visit (INDEPENDENT_AMBULATORY_CARE_PROVIDER_SITE_OTHER): Payer: Medicare Other | Admitting: Internal Medicine

## 2018-05-10 ENCOUNTER — Encounter: Payer: Self-pay | Admitting: Internal Medicine

## 2018-05-10 DIAGNOSIS — R0602 Shortness of breath: Secondary | ICD-10-CM | POA: Insufficient documentation

## 2018-05-10 LAB — COMPREHENSIVE METABOLIC PANEL
ALT: 17 U/L (ref 0–35)
AST: 22 U/L (ref 0–37)
Albumin: 4.6 g/dL (ref 3.5–5.2)
Alkaline Phosphatase: 96 U/L (ref 39–117)
BUN: 14 mg/dL (ref 6–23)
CO2: 32 mEq/L (ref 19–32)
Calcium: 9.7 mg/dL (ref 8.4–10.5)
Chloride: 101 mEq/L (ref 96–112)
Creatinine, Ser: 0.83 mg/dL (ref 0.40–1.20)
GFR: 68.68 mL/min (ref >60.00)
Glucose, Bld: 114 mg/dL — ABNORMAL HIGH (ref 70–99)
Potassium: 3.8 mEq/L (ref 3.5–5.1)
Sodium: 140 mEq/L (ref 135–145)
Total Bilirubin: 0.5 mg/dL (ref 0.2–1.2)
Total Protein: 7.7 g/dL (ref 6.0–8.3)

## 2018-05-10 LAB — CBC
HCT: 40.9 % (ref 36.0–46.0)
Hemoglobin: 14 g/dL (ref 12.0–15.0)
MCHC: 34.1 g/dL (ref 30.0–36.0)
MCV: 92.5 fl (ref 78.0–100.0)
Platelets: 138 10*3/uL — ABNORMAL LOW (ref 150.0–400.0)
RBC: 4.42 Mil/uL (ref 3.87–5.11)
RDW: 12.9 % (ref 11.5–15.5)
WBC: 5.9 10*3/uL (ref 4.0–10.5)

## 2018-05-10 LAB — BRAIN NATRIURETIC PEPTIDE: Pro B Natriuretic peptide (BNP): 84 pg/mL (ref 0.0–100.0)

## 2018-05-10 LAB — SAR COV2 SEROLOGY (COVID19)AB(IGG),IA: SARS CoV2 AB IGG: NEGATIVE

## 2018-05-10 NOTE — Progress Notes (Signed)
Virtual Visit via Video Note  I connected with Patricia Dillon on 05/10/18 at  2:40 PM EDT by a video enabled telemedicine application and verified that I am speaking with the correct person using two identifiers.  The patient and the provider were at separate locations throughout the entire encounter.   I discussed the limitations of evaluation and management by telemedicine and the availability of in person appointments. The patient expressed understanding and agreed to proceed.  History of Present Illness: The patient is a 68 y.o. female with visit for persistent problems with SOB. She is breathing fairly normally. When it hits her she will get the feeling like she cannot get a full breath. This started back in March with flu like illness. Not tested for coronavirus at the time but presumed. Started improving slightly but is random and still happening. She was having palpitations as well but not having those anymore. Has no fevers or chills or cough currently. Denies significant fatigue. Overall it is improving slightly. Getting holter done end of May. Has tried deep breathing and this maybe helps some but unclear. She does have some stress but these episodes do not correlate to any emotional distress  Observations/Objective: Appearance: normal, breathing appears normal, normal grooming, abdomen does not appear distended, throat normal, memory normal, mental status is A and O times 3  Assessment and Plan: See problem oriented charting  Follow Up Instructions: checking CBC, CMP, antibody testing for covid-19, and BNP  I discussed the assessment and treatment plan with the patient. The patient was provided an opportunity to ask questions and all were answered. The patient agreed with the plan and demonstrated an understanding of the instructions.   The patient was advised to call back or seek an in-person evaluation if the symptoms worsen or if the condition fails to improve as  anticipated.  Hoyt Koch, MD

## 2018-05-10 NOTE — Assessment & Plan Note (Signed)
Checking CBC, CMP, antibody testing for covid-19 and BNP to rule out some causes. Encouraged continued deep breathing techniques.

## 2018-05-23 ENCOUNTER — Telehealth: Payer: Self-pay | Admitting: *Deleted

## 2018-05-23 NOTE — Telephone Encounter (Signed)
3 day ZIO XT to be mailed to patients home.  Instructions reviewed briefly as they are included in the monitor kit.

## 2018-05-30 ENCOUNTER — Ambulatory Visit: Payer: Medicare Other

## 2018-06-05 ENCOUNTER — Ambulatory Visit: Payer: Medicare Other

## 2018-06-06 ENCOUNTER — Ambulatory Visit (INDEPENDENT_AMBULATORY_CARE_PROVIDER_SITE_OTHER): Payer: Medicare Other

## 2018-06-06 DIAGNOSIS — R002 Palpitations: Secondary | ICD-10-CM | POA: Diagnosis not present

## 2018-06-18 ENCOUNTER — Other Ambulatory Visit: Payer: Self-pay

## 2018-06-18 DIAGNOSIS — R002 Palpitations: Secondary | ICD-10-CM | POA: Diagnosis not present

## 2018-07-19 ENCOUNTER — Other Ambulatory Visit: Payer: Self-pay

## 2018-07-19 ENCOUNTER — Ambulatory Visit
Admission: RE | Admit: 2018-07-19 | Discharge: 2018-07-19 | Disposition: A | Discharge status: Home / Self Care | Payer: Medicare Other | Source: Ambulatory Visit | Attending: Internal Medicine | Admitting: Internal Medicine

## 2018-07-19 DIAGNOSIS — Z1231 Encounter for screening mammogram for malignant neoplasm of breast: Secondary | ICD-10-CM

## 2018-07-24 ENCOUNTER — Encounter: Payer: Self-pay | Admitting: Internal Medicine

## 2018-09-05 ENCOUNTER — Ambulatory Visit (INDEPENDENT_AMBULATORY_CARE_PROVIDER_SITE_OTHER): Payer: Medicare Other

## 2018-09-05 ENCOUNTER — Other Ambulatory Visit: Payer: Self-pay

## 2018-09-05 DIAGNOSIS — Z23 Encounter for immunization: Secondary | ICD-10-CM | POA: Diagnosis not present

## 2018-09-16 ENCOUNTER — Ambulatory Visit (INDEPENDENT_AMBULATORY_CARE_PROVIDER_SITE_OTHER): Payer: Medicare Other | Admitting: Internal Medicine

## 2018-09-16 ENCOUNTER — Encounter: Payer: Self-pay | Admitting: Internal Medicine

## 2018-09-16 DIAGNOSIS — I340 Nonrheumatic mitral (valve) insufficiency: Secondary | ICD-10-CM | POA: Diagnosis not present

## 2018-09-16 DIAGNOSIS — R0602 Shortness of breath: Secondary | ICD-10-CM | POA: Diagnosis not present

## 2018-09-16 NOTE — Assessment & Plan Note (Signed)
Will bring her in for office visit for FENO. Potentially needs ECHO if this is normal given hx MVR which has not been assessed in >5-10 years.

## 2018-09-16 NOTE — Progress Notes (Signed)
Virtual Visit via Video Note  I connected with Patricia Dillon on 09/16/18 at  8:00 AM EDT by a video enabled telemedicine application and verified that I am speaking with the correct person using two identifiers.  The patient and the provider were at separate locations throughout the entire encounter.   I discussed the limitations of evaluation and management by telemedicine and the availability of in person appointments. The patient expressed understanding and agreed to proceed.  History of Present Illness: The patient is a 68 y.o. female with visit for reviewing results of her holter monitor. She was having palpitations back in May and April and these had gotten better. She had holter monitor done which did not show abnormal rhytmn. She had rare 8-9 beat episodes of SVT which did not correlate with symptoms which she was having 20 times or more per day. She is not having palpitations. She is having the sensation that she cannot take a deep breath and she needs to. She then tries to breathe deeply and after a couple breaths this passes. Started back in April and has not improved. Has not worsened. Denies cough or SOB with exercise. This happens randomly and not always at rest or during exercise. Overall it is stable. Has tried nothing except deep breathing.  Observations/Objective: Appearance: normal, breathing appears normal, no coughing or SOB during visit, casual grooming, abdomen does not appear distended, throat normal, memory normal, mental status is A and O times 3  Assessment and Plan: See problem oriented charting  Follow Up Instructions: bring into office for FENO and 5 minute walk test, potentially ECHO to check on MVR  Visit time 25 minutes: greater than 50% of that time was spent in face to face counseling and coordination of care with the patient: counseled about results of holter monitor as well as next steps for assessing potential causes of her ongoing symptoms.   I discussed the  assessment and treatment plan with the patient. The patient was provided an opportunity to ask questions and all were answered. The patient agreed with the plan and demonstrated an understanding of the instructions.   The patient was advised to call back or seek an in-person evaluation if the symptoms worsen or if the condition fails to improve as anticipated.  Hoyt Koch, MD

## 2018-09-16 NOTE — Assessment & Plan Note (Signed)
No follow up in some time. With new SOB symptoms may need follow up echo. Will check FENO first due to this starting with potential covid-19 or other URI back in Mach/April.

## 2018-09-19 ENCOUNTER — Other Ambulatory Visit: Payer: Self-pay | Admitting: Internal Medicine

## 2018-09-19 DIAGNOSIS — R0602 Shortness of breath: Secondary | ICD-10-CM

## 2018-09-20 ENCOUNTER — Ambulatory Visit (INDEPENDENT_AMBULATORY_CARE_PROVIDER_SITE_OTHER): Payer: Medicare Other

## 2018-09-20 ENCOUNTER — Other Ambulatory Visit: Payer: Self-pay

## 2018-09-20 ENCOUNTER — Ambulatory Visit: Payer: Medicare Other | Admitting: Internal Medicine

## 2018-09-20 ENCOUNTER — Ambulatory Visit (INDEPENDENT_AMBULATORY_CARE_PROVIDER_SITE_OTHER): Payer: Medicare Other | Admitting: Pulmonary Disease

## 2018-09-20 ENCOUNTER — Encounter: Payer: Self-pay | Admitting: Pulmonary Disease

## 2018-09-20 VITALS — BP 118/66 | HR 88 | Temp 97.8°F | Ht 68.0 in | Wt 117.2 lb

## 2018-09-20 DIAGNOSIS — I270 Primary pulmonary hypertension: Secondary | ICD-10-CM | POA: Diagnosis not present

## 2018-09-20 DIAGNOSIS — R0602 Shortness of breath: Secondary | ICD-10-CM

## 2018-09-20 DIAGNOSIS — R0609 Other forms of dyspnea: Secondary | ICD-10-CM

## 2018-09-20 DIAGNOSIS — R0600 Dyspnea, unspecified: Secondary | ICD-10-CM | POA: Diagnosis not present

## 2018-09-20 LAB — SEDIMENTATION RATE: Sed Rate: 22 mm/h (ref 0–30)

## 2018-09-20 NOTE — Patient Instructions (Signed)
  We will obtain a pulmonary function test Obtain echocardiogram to assess cardiac function and right-sided heart pressures Chest x-ray  ANA, ESR-basic evaluation for connective tissue problems  We will see you back in the office in about 4 to 6 weeks  Graded exercises as able  Call with significant concerns

## 2018-09-20 NOTE — Progress Notes (Signed)
Patricia Dillon    [id number]    Jul 18, 1950  Primary Care Physician:Crawford, Real Cons, MD  Referring Physician: Hoyt Koch, MD [place],  [place] [address]  Chief complaint:   Patient with complaints of been more aware of a breath, stated she cannot seem to get a deep enough breath  HPI:  She does have questionable shortness of breath with activity Sometimes when she is just standing around feels she has to take a deeper breath to expand the lungs She does not feel she is limited with normal walking Not limited with walking uphill  No diagnosis of lung disease  History of lichen planus No history of arthritis, no skin rash, no swallowing difficulty  History of SVTs Mitral valve prolapse  Never smoker  No pertinent occupational history  No hobbies that exposes her to fumes, dust, chemicals  No contributory family history  Outpatient Encounter Medications as of 09/20/2018  Medication Sig  . cholecalciferol (VITAMIN D) 1000 units tablet Take 1,000 Units by mouth daily.   No facility-administered encounter medications on file as of 09/20/2018.     Allergies as of 09/20/2018 - Review Complete 09/20/2018  Allergen Reaction Noted  . Vancomycin Itching 12/30/2013  . Penicillins Rash 07/26/2012    Past Medical History:  Diagnosis Date  . Hyperlipidemia   . Lichen sclerosus   . Mitral valve regurgitation   . Osteopenia     Past Surgical History:  Procedure Laterality Date  . BREAST BIOPSY Left    benign  . BREAST EXCISIONAL BIOPSY Left     Family History  Problem Relation Age of Onset  . Hypertension Mother   . Heart disease Father     Social History   Socioeconomic History  . Marital status: Divorced    Spouse name: Not on file  . Number of children: Not on file  . Years of education: Not on file  . Highest education level: Not on file  Occupational History  . Not on file  Social Needs  . Financial resource  strain: Not on file  . Food insecurity    Worry: Not on file    Inability: Not on file  . Transportation needs    Medical: Not on file    Non-medical: Not on file  Tobacco Use  . Smoking status: Never Smoker  . Smokeless tobacco: Never Used  Substance and Sexual Activity  . Alcohol use: No    Alcohol/week: 0.0 standard drinks  . Drug use: No  . Sexual activity: Not on file  Lifestyle  . Physical activity    Days per week: Not on file    Minutes per session: Not on file  . Stress: Not on file  Relationships  . Social Herbalist on phone: Not on file    Gets together: Not on file    Attends religious service: Not on file    Active member of club or organization: Not on file    Attends meetings of clubs or organizations: Not on file    Relationship status: Not on file  . Intimate partner violence    Fear of current or ex partner: Not on file    Emotionally abused: Not on file    Physically abused: Not on file    Forced sexual activity: Not on file  Other Topics Concern  . Not on file  Social History Narrative  . Not on file  Review of Systems  Constitutional: Negative.   HENT: Negative.   Respiratory: Negative.   Cardiovascular: Negative.   Gastrointestinal: Negative.   Endocrine: Negative.   Genitourinary: Negative.   Musculoskeletal: Positive for myalgias.       Senses some heaviness in the lower extremity    Vitals:   09/20/18 1600  BP: 118/66  Pulse: 88  Temp: 97.8 F (36.6 C)  SpO2: 98%     Physical Exam  Constitutional: She appears well-developed and well-nourished.  HENT:  Head: Normocephalic and atraumatic.  Eyes: Pupils are equal, round, and reactive to light. Right eye exhibits no discharge. Left eye exhibits no discharge.  Neck: Normal range of motion. Neck supple. No tracheal deviation present. No thyromegaly present.  Cardiovascular: Normal rate and regular rhythm.  Pulmonary/Chest: Effort normal and breath sounds normal. No  respiratory distress. She has no wheezes. She has no rales. She exhibits no tenderness.  Abdominal: Soft. Bowel sounds are normal. She exhibits no distension.  Musculoskeletal:        General: No edema.  Neurological: She is alert.  Skin: Skin is warm and dry. No erythema.    Assessment:  Shortness of breath -Denies underlying lung disease -Denies any significant predisposition to lung disease -Symptoms more apparent since upper respiratory infection earlier in the year  Plan/Recommendations:  Obtain pulmonary function test  Obtain chest x-ray  Echocardiogram  Obtain ANA, ESR-proximal muscle weakness in the lower extremity  Encouraged to perform graded exercises as tolerated  We will see her back in the office in about 6 to 8 weeks     Sherrilyn Rist MD [facility] 09/20/2018, 4:06 PM  CC: Hoyt Koch, *

## 2018-09-21 LAB — ANA W/REFLEX: Anti Nuclear Antibody (ANA): NEGATIVE

## 2018-09-25 ENCOUNTER — Other Ambulatory Visit (HOSPITAL_COMMUNITY): Payer: Medicare Other

## 2018-09-26 ENCOUNTER — Other Ambulatory Visit: Payer: Self-pay

## 2018-09-26 ENCOUNTER — Ambulatory Visit (HOSPITAL_COMMUNITY): Payer: Medicare Other | Attending: Pulmonary Disease

## 2018-09-26 DIAGNOSIS — I270 Primary pulmonary hypertension: Secondary | ICD-10-CM | POA: Diagnosis not present

## 2018-10-09 ENCOUNTER — Telehealth: Payer: Self-pay | Admitting: Pulmonary Disease

## 2018-10-10 NOTE — Telephone Encounter (Signed)
Left message for patient to call back  

## 2018-10-10 NOTE — Telephone Encounter (Signed)
Pt calling back about covid test

## 2018-10-11 NOTE — Telephone Encounter (Signed)
Spoke with pt, she was scheduled for Covid test this am. Nothing further is needed.

## 2018-10-16 DIAGNOSIS — H25013 Cortical age-related cataract, bilateral: Secondary | ICD-10-CM | POA: Diagnosis not present

## 2018-10-16 DIAGNOSIS — H524 Presbyopia: Secondary | ICD-10-CM | POA: Diagnosis not present

## 2018-10-16 DIAGNOSIS — H2513 Age-related nuclear cataract, bilateral: Secondary | ICD-10-CM | POA: Diagnosis not present

## 2018-10-16 DIAGNOSIS — H43813 Vitreous degeneration, bilateral: Secondary | ICD-10-CM | POA: Diagnosis not present

## 2018-10-26 ENCOUNTER — Other Ambulatory Visit (HOSPITAL_COMMUNITY)
Admission: RE | Admit: 2018-10-26 | Discharge: 2018-10-26 | Disposition: A | Discharge status: Home / Self Care | Payer: Medicare Other | Source: Ambulatory Visit | Attending: Pulmonary Disease | Admitting: Pulmonary Disease

## 2018-10-26 DIAGNOSIS — Z20828 Contact with and (suspected) exposure to other viral communicable diseases: Secondary | ICD-10-CM | POA: Diagnosis not present

## 2018-10-26 DIAGNOSIS — Z01812 Encounter for preprocedural laboratory examination: Secondary | ICD-10-CM | POA: Insufficient documentation

## 2018-10-27 LAB — NOVEL CORONAVIRUS, NAA (HOSP ORDER, SEND-OUT TO REF LAB; TAT 18-24 HRS): SARS-CoV-2, NAA: NOT DETECTED

## 2018-10-30 ENCOUNTER — Ambulatory Visit (INDEPENDENT_AMBULATORY_CARE_PROVIDER_SITE_OTHER): Payer: Medicare Other | Admitting: Pulmonary Disease

## 2018-10-30 ENCOUNTER — Other Ambulatory Visit: Payer: Self-pay

## 2018-10-30 ENCOUNTER — Encounter: Payer: Self-pay | Admitting: Pulmonary Disease

## 2018-10-30 VITALS — BP 118/68 | HR 89 | Ht 67.5 in | Wt 117.0 lb

## 2018-10-30 DIAGNOSIS — R0602 Shortness of breath: Secondary | ICD-10-CM

## 2018-10-30 LAB — PULMONARY FUNCTION TEST
DL/VA % pred: 132 %
DL/VA: 5.39 ml/min/mmHg/L
DLCO unc % pred: 102 %
DLCO unc: 22.83 ml/min/mmHg
FEF 25-75 Post: 1.83 L/sec
FEF 25-75 Pre: 1.46 L/sec
FEF2575-%Change-Post: 25 %
FEF2575-%Pred-Post: 79 %
FEF2575-%Pred-Pre: 63 %
FEV1-%Change-Post: 5 %
FEV1-%Pred-Post: 80 %
FEV1-%Pred-Pre: 76 %
FEV1-Post: 2.19 L
FEV1-Pre: 2.08 L
FEV1FVC-%Change-Post: 5 %
FEV1FVC-%Pred-Pre: 95 %
FEV6-%Change-Post: 0 %
FEV6-%Pred-Post: 83 %
FEV6-%Pred-Pre: 83 %
FEV6-Post: 2.85 L
FEV6-Pre: 2.85 L
FEV6FVC-%Pred-Post: 104 %
FEV6FVC-%Pred-Pre: 104 %
FVC-%Change-Post: 0 %
FVC-%Pred-Post: 79 %
FVC-%Pred-Pre: 79 %
FVC-Post: 2.85 L
FVC-Pre: 2.85 L
Post FEV1/FVC ratio: 77 %
Post FEV6/FVC ratio: 100 %
Pre FEV1/FVC ratio: 73 %
Pre FEV6/FVC Ratio: 100 %
RV % pred: 116 %
RV: 2.66 L
TLC % pred: 95 %
TLC: 5.3 L

## 2018-10-30 NOTE — Patient Instructions (Signed)
With your improvement in sensation of breathlessness  No changes will be made at present  Your studies were reviewed during the visit today  I will see you back in about 6 months  If you change your mind about trying an inhaler we can call that into pharmacy for your  Call with significant concerns

## 2018-10-30 NOTE — Progress Notes (Signed)
Patricia Dillon    [id number]    29-Apr-1950  Primary Care Physician:Crawford, Real Cons, MD  Referring Physician: Hoyt Koch, MD [address] [place],  [place] [address]  Chief complaint:   Patient with complaints of been more aware of a breath, stated she cannot seem to get a deep enough breath  HPI:  She does have questionable shortness of breath with activity Sometimes when she is just standing around feels she has to take a deeper breath to expand the lungs She does not feel she is limited with normal walking Not limited with walking uphill  Symptoms are little bit better compared to during last visit No changes to her usual health  Activity levels usually revolves around chasing after 2 grandkids  No diagnosis of lung disease  History of lichen planus No history of arthritis, no skin rash, no swallowing difficulty  History of SVTs Mitral valve prolapse  Never smoker  No pertinent occupational history  No hobbies that exposes her to fumes, dust, chemicals  No contributory family history  Outpatient Encounter Medications as of 10/30/2018  Medication Sig  . cholecalciferol (VITAMIN D) 1000 units tablet Take 1,000 Units by mouth daily.   No facility-administered encounter medications on file as of 10/30/2018.     Allergies as of 10/30/2018 - Review Complete 10/30/2018  Allergen Reaction Noted  . Vancomycin Itching 12/30/2013  . Penicillins Rash 07/26/2012    Past Medical History:  Diagnosis Date  . Hyperlipidemia   . Lichen sclerosus   . Mitral valve regurgitation   . Osteopenia     Past Surgical History:  Procedure Laterality Date  . BREAST BIOPSY Left    benign  . BREAST EXCISIONAL BIOPSY Left     Family History  Problem Relation Age of Onset  . Hypertension Mother   . Heart disease Father     Social History   Socioeconomic History  . Marital status: Divorced    Spouse name: Not on file  . Number of  children: Not on file  . Years of education: Not on file  . Highest education level: Not on file  Occupational History  . Not on file  Social Needs  . Financial resource strain: Not on file  . Food insecurity    Worry: Not on file    Inability: Not on file  . Transportation needs    Medical: Not on file    Non-medical: Not on file  Tobacco Use  . Smoking status: Never Smoker  . Smokeless tobacco: Never Used  Substance and Sexual Activity  . Alcohol use: No    Alcohol/week: 0.0 standard drinks  . Drug use: No  . Sexual activity: Not on file  Lifestyle  . Physical activity    Days per week: Not on file    Minutes per session: Not on file  . Stress: Not on file  Relationships  . Social Herbalist on phone: Not on file    Gets together: Not on file    Attends religious service: Not on file    Active member of club or organization: Not on file    Attends meetings of clubs or organizations: Not on file    Relationship status: Not on file  . Intimate partner violence    Fear of current or ex partner: Not on file    Emotionally abused: Not on file    Physically abused: Not on file  Forced sexual activity: Not on file  Other Topics Concern  . Not on file  Social History Narrative  . Not on file    Review of Systems  Constitutional: Negative.   HENT: Negative.   Respiratory: Negative.  Negative for shortness of breath.   Cardiovascular: Negative.   Gastrointestinal: Negative.   Endocrine: Negative.   Genitourinary: Negative.   Musculoskeletal: Positive for myalgias.       Senses some heaviness in the lower extremity    Vitals:   10/30/18 1415  BP: 118/68  Pulse: 89  SpO2: 96%     Physical Exam  Constitutional: She appears well-developed and well-nourished.  HENT:  Head: Normocephalic and atraumatic.  Eyes: Pupils are equal, round, and reactive to light. Right eye exhibits no discharge. Left eye exhibits no discharge.  Neck: Normal range of  motion. Neck supple. No tracheal deviation present. No thyromegaly present.  Cardiovascular: Normal rate and regular rhythm.  Pulmonary/Chest: Effort normal and breath sounds normal. No respiratory distress. She has no wheezes. She has no rales. She exhibits no tenderness.  Abdominal: Soft. Bowel sounds are normal. She exhibits no distension.   ANA, ESR within normal limits Echocardiogram did show borderline increased systolic pressure, right-sided cardiac function was normal PFT showed no obstruction, no restriction, normal diffusing capacity there was a 25% improvement in FEF 25-75 the bronchodilators  Assessment:   Shortness of breath -Denies underlying lung disease -Denies any significant predisposition to lung disease -Symptoms more apparent since upper respiratory infection earlier in the year  With this significant improvement in FEF 25-75, she was offered a bronchodilator which she elects to defer at present I did encourage her to give me a call if she is open to trying an inhaler to see if this benefits her shortness of breath  Plan/Recommendations:  No other testing needed at the present time  Encouraged to call if she has any significant concerns  Encouraged to call if she changes her mind about trying a rescue inhaler  The importance of adding graded regular exercises was discussed    Sherrilyn Rist MD [facility] 10/30/2018, 3:02 PM  CC: Hoyt Koch, *

## 2018-10-30 NOTE — Progress Notes (Signed)
PFT done today. 

## 2018-11-27 ENCOUNTER — Other Ambulatory Visit: Payer: Self-pay

## 2019-01-15 ENCOUNTER — Telehealth: Payer: Self-pay | Admitting: Physician Assistant

## 2019-01-15 ENCOUNTER — Ambulatory Visit (INDEPENDENT_AMBULATORY_CARE_PROVIDER_SITE_OTHER): Payer: Medicare Other | Admitting: Physician Assistant

## 2019-01-15 ENCOUNTER — Encounter: Payer: Self-pay | Admitting: Physician Assistant

## 2019-01-15 VITALS — BP 144/80 | HR 68 | Temp 96.3°F | Ht 67.5 in | Wt 117.3 lb

## 2019-01-15 DIAGNOSIS — Z01818 Encounter for other preprocedural examination: Secondary | ICD-10-CM

## 2019-01-15 DIAGNOSIS — K625 Hemorrhage of anus and rectum: Secondary | ICD-10-CM | POA: Diagnosis not present

## 2019-01-15 DIAGNOSIS — R195 Other fecal abnormalities: Secondary | ICD-10-CM | POA: Diagnosis not present

## 2019-01-15 MED ORDER — NA SULFATE-K SULFATE-MG SULF 17.5-3.13-1.6 GM/177ML PO SOLN: 1.0000 | Freq: Once | ORAL | 0 refills | Status: AC

## 2019-01-15 NOTE — Patient Instructions (Signed)
If you are age 68 or older, your body mass index should be between 23-30. Your Body mass index is 18.1 kg/m. If this is out of the aforementioned range listed, please consider follow up with your Primary Care Provider.  If you are age 71 or younger, your body mass index should be between 19-25. Your Body mass index is 18.1 kg/m. If this is out of the aformentioned range listed, please consider follow up with your Primary Care Provider.   You have been scheduled for a colonoscopy. Please follow written instructions given to you at your visit today.  Please pick up your prep supplies at the pharmacy within the next 1-3 days. If you use inhalers (even only as needed), please bring them with you on the day of your procedure.

## 2019-01-15 NOTE — Progress Notes (Signed)
Chief Complaint: Rectal bleeding, discuss colonoscopy  HPI:    Patricia Dillon is a 69 year old Caucasian female with a past medical history as listed below including mitral valve regurgitation, who was referred to me by Hoyt Koch, * for a complaint of rectal bleeding and discussion of a colonoscopy.     Colonoscopy report from [place] with a colonoscopy 04/14/2010 for change in bowel habits which was normal other than small internal hemorrhoids.      Today, the patient presents to clinic and explains that she has had 3 colonoscopies in her life 1 in 2002, one in 2006 and the last in 2012.  She brings a report from 2012 with her as above.  Tells me that all of them are always normal other than internal hemorrhoids.  Most recently, her daughter who is 5 and her son who are 66 both had colonoscopies and both had polyps which were sessile serrated polyps.  This makes the patient more worried as her daughter was having some rectal bleeding and ended up having a colonoscopy and finding polyps.  She tells me that she did see some bright red blood per rectum for about 5 months off and on but assumed that this is from her hemorrhoids.  She saw her gynecologist who examined her rectally and told her she did not see anything bleeding and recommended she follow-up with [place].    Along with above patient has continued to have variance in her stools telling me that sometimes they can be looser than others.  In the past she has always attributed this to fiber in her diet, tells me she is very sensitive and if she happens to eat more fiber in a day she will have looser stool.  Also describes some gas which accompanies all of this and just balances her diet to avoid the symptoms.  She has been doing this for years.    Denies fever, chills, weight loss, anorexia, nausea, vomiting, heartburn, reflux or symptoms that awaken her from sleep.  Past Medical History:  Diagnosis Date  . External hemorrhoids   .  Hyperlipidemia   . Lichen sclerosus   . Mitral valve regurgitation   . Osteopenia     Past Surgical History:  Procedure Laterality Date  . BREAST BIOPSY Left    benign  . BREAST EXCISIONAL BIOPSY Left     Current Outpatient Medications  Medication Sig Dispense Refill  . cholecalciferol (VITAMIN D) 1000 units tablet Take 1,000 Units by mouth daily.     No current facility-administered medications for this visit.    Allergies as of 01/15/2019 - Review Complete 01/06/2019  Allergen Reaction Noted  . Vancomycin Itching 12/30/2013  . Penicillins Rash 07/26/2012    Family History  Problem Relation Age of Onset  . Hypertension Mother   . Heart disease Father   . Melanoma Daughter        age 78    Social History   Socioeconomic History  . Marital status: Divorced    Spouse name: Not on file  . Number of children: Not on file  . Years of education: Not on file  . Highest education level: Not on file  Occupational History  . Not on file  Tobacco Use  . Smoking status: Never Smoker  . Smokeless tobacco: Never Used  Substance and Sexual Activity  . Alcohol use: No    Alcohol/week: 0.0 standard drinks  . Drug use: No  . Sexual activity: Not on file  Other Topics Concern  . Not on file  Social History Narrative  . Not on file   Social Determinants of Health   Financial Resource Strain:   . Difficulty of Paying Living Expenses: Not on file  Food Insecurity:   . Worried About Charity fundraiser in the Last Year: Not on file  . Ran Out of Food in the Last Year: Not on file  Transportation Needs:   . Lack of Transportation (Medical): Not on file  . Lack of Transportation (Non-Medical): Not on file  Physical Activity:   . Days of Exercise per Week: Not on file  . Minutes of Exercise per Session: Not on file  Stress:   . Feeling of Stress : Not on file  Social Connections:   . Frequency of Communication with Friends and Family: Not on file  . Frequency of Social  Gatherings with Friends and Family: Not on file  . Attends Religious Services: Not on file  . Active Member of Clubs or Organizations: Not on file  . Attends Archivist Meetings: Not on file  . Marital Status: Not on file  Intimate Partner Violence:   . Fear of Current or Ex-Partner: Not on file  . Emotionally Abused: Not on file  . Physically Abused: Not on file  . Sexually Abused: Not on file    Review of Systems:    Constitutional: No weight loss, fever or chills Skin: No rash  Cardiovascular: No chest pain Respiratory: No SOB  Gastrointestinal: See HPI and otherwise negative Genitourinary: No dysuria  Neurological: No headache Musculoskeletal: No new muscle or joint pain Hematologic: No bruising Psychiatric: No history of depression or anxiety   Physical Exam:  Vital signs: BP (!) 144/80   Pulse 68   Temp (!) 96.3 F (35.7 C)   Ht 5' 7.5" (1.715 m)   Wt 117 lb 5 oz (53.2 kg)   BMI 18.10 kg/m   Constitutional:   Pleasant Elderly Caucasian female appears to be in NAD, Well developed, Well nourished, alert and cooperative Head:  Normocephalic and atraumatic. Eyes:   PEERL, EOMI. No icterus. Conjunctiva pink. Ears:  Normal auditory acuity. Neck:  Supple Throat: Oral cavity and pharynx without inflammation, swelling or lesion.  Respiratory: Respirations even and unlabored. Lungs clear to auscultation bilaterally.   No wheezes, crackles, or rhonchi.  Cardiovascular: Normal S1, S2. No MRG. Regular rate and rhythm. No peripheral edema, cyanosis or pallor.  Gastrointestinal:  Soft, nondistended, nontender. No rebound or guarding. Normal bowel sounds. No appreciable masses or hepatomegaly. Rectal:  Not performed.  Msk:  Symmetrical without gross deformities. Without edema, no deformity or joint abnormality.  Neurologic:  Alert and  oriented x4;  grossly normal neurologically.  Skin:   Dry and intact without significant lesions or rashes. Psychiatric: Demonstrates  good judgement and reason without abnormal affect or behaviors.  MOST RECENT LABS AND IMAGING: CBC    Component Value Date/Time   WBC 5.9 05/10/2018 1531   RBC 4.42 05/10/2018 1531   HGB 14.0 05/10/2018 1531   HGB 13.9 07/11/2016 0000   HCT 40.9 05/10/2018 1531   HCT 43.8 07/11/2016 0000   PLT 138.0 (L) 05/10/2018 1531   PLT 124 (L) 07/11/2016 0000   MCV 92.5 05/10/2018 1531   MCV 97 07/11/2016 0000   MCH 30.2 03/22/2018 2212   MCHC 34.1 05/10/2018 1531   RDW 12.9 05/10/2018 1531   RDW 14.0 07/11/2016 0000   LYMPHSABS 1.0 03/22/2018 2212   MONOABS  0.5 03/22/2018 2212   EOSABS 0.1 03/22/2018 2212   BASOSABS 0.0 03/22/2018 2212    CMP     Component Value Date/Time   NA 140 05/10/2018 1531   NA 144 07/11/2016 0000   K 3.8 05/10/2018 1531   CL 101 05/10/2018 1531   CO2 32 05/10/2018 1531   GLUCOSE 114 (H) 05/10/2018 1531   BUN 14 05/10/2018 1531   BUN 20 07/11/2016 0000   CREATININE 0.83 05/10/2018 1531   CALCIUM 9.7 05/10/2018 1531   PROT 7.7 05/10/2018 1531   PROT 6.8 07/11/2016 0000   ALBUMIN 4.6 05/10/2018 1531   ALBUMIN 4.5 07/11/2016 0000   AST 22 05/10/2018 1531   ALT 17 05/10/2018 1531   ALKPHOS 96 05/10/2018 1531   BILITOT 0.5 05/10/2018 1531   BILITOT 0.8 07/11/2016 0000   GFRNONAA 57 (L) 03/22/2018 2212   GFRAA >60 03/22/2018 2212    Assessment: 1.  Rectal bleeding: Off-and-on for about 5 months, assumed this was due to her known hemorrhoids, but saw a gynecologist who recommended she follow-up with [place], son and daughter both diagnosed with polyps in their 71s recently; consider hemorrhoids versus polyp versus other 2.  Loose stools: Off-and-on, per patient due to fiber in her diet, has been evaluated with multiple colonoscopies in the past with no findings  Plan: 1.  Scheduled the patient for a diagnostic colonoscopy in the [place] due to a change in bowel habits and rectal bleeding.  She requested Dr. Hilarie Fredrickson as this is who she was referred to.  Did discuss  risks, benefits, limitations and alternatives and the patient agrees to proceed.  Patient will be Covid tested 2 days prior to time of procedure. 2.  Discussed a probiotic or other ways that the patient could try to help with the variance in her stool but she tells me she likes to balance this through her diet. 3.  Patient to follow in clinic per recommendations from Dr. Hilarie Fredrickson after time of procedure.  Ellouise Newer, PA-C [facility] 01/15/2019, 9:36 AM  Cc: Hoyt Koch, *

## 2019-01-16 ENCOUNTER — Telehealth: Payer: Self-pay

## 2019-01-16 NOTE — Telephone Encounter (Signed)
Patient returned your call she will be coming in tomorrow morning by 8:30am to pick up kit. Thanks

## 2019-01-16 NOTE — Telephone Encounter (Signed)
Called patient and left message that we have a suprep kit that she can pick-up from our office, to please call back if she wants me to leave it at the front desk

## 2019-01-21 ENCOUNTER — Ambulatory Visit: Payer: Medicare Other | Attending: Internal Medicine

## 2019-01-21 DIAGNOSIS — Z23 Encounter for immunization: Secondary | ICD-10-CM | POA: Diagnosis not present

## 2019-01-21 NOTE — Progress Notes (Signed)
   Covid-19 Vaccination Clinic  Name:  Patricia Dillon    MRN: [id number] DOB: 11/23/1950  01/21/2019  Patricia Dillon was observed post Covid-19 immunization for 15 minutes without incidence. She was provided with Vaccine Information Sheet and instruction to access the V-Safe system.   Patricia Dillon was instructed to call 911 with any severe reactions post vaccine: Marland Kitchen Difficulty breathing  . Swelling of your face and throat  . A fast heartbeat  . A bad rash all over your body  . Dizziness and weakness    Immunizations Administered    Name Date Dose VIS Date Route   Pfizer COVID-19 Vaccine 01/21/2019  3:20 PM 0.3 mL 12/13/2018 Intramuscular   Manufacturer: [employer]   Lot: [id number]   [place]: [id number]

## 2019-01-22 ENCOUNTER — Encounter: Payer: Self-pay | Admitting: Internal Medicine

## 2019-01-23 NOTE — Progress Notes (Signed)
Addendum: Reviewed and agree with assessment and management plan. Zalma Channing M, MD  

## 2019-02-04 ENCOUNTER — Ambulatory Visit: Payer: Medicare Other

## 2019-02-10 ENCOUNTER — Encounter: Payer: Self-pay | Admitting: Internal Medicine

## 2019-02-10 ENCOUNTER — Ambulatory Visit: Payer: Medicare Other

## 2019-02-11 ENCOUNTER — Ambulatory Visit: Payer: Medicare Other

## 2019-02-11 ENCOUNTER — Ambulatory Visit: Payer: Medicare Other | Attending: Internal Medicine

## 2019-02-11 DIAGNOSIS — Z23 Encounter for immunization: Secondary | ICD-10-CM | POA: Insufficient documentation

## 2019-02-11 NOTE — Progress Notes (Signed)
   Covid-19 Vaccination Clinic  Name:  Patricia Dillon    MRN: [id number] DOB: 1950-01-12  02/11/2019  [name] was observed post Covid-19 immunization for 15 minutes without incidence. She was provided with Vaccine Information Sheet and instruction to access the V-Safe system.   [name] was instructed to call 911 with any severe reactions post vaccine: Marland Kitchen Difficulty breathing  . Swelling of your face and throat  . A fast heartbeat  . A bad rash all over your body  . Dizziness and weakness    Immunizations Administered    Name Date Dose VIS Date Route   Pfizer COVID-19 Vaccine 02/11/2019 11:00 AM 0.3 mL 12/13/2018 Intramuscular   Manufacturer: [place]   Lot: [id number]   [place]: [id number]

## 2019-02-14 ENCOUNTER — Ambulatory Visit: Payer: Medicare Other

## 2019-02-14 ENCOUNTER — Ambulatory Visit (INDEPENDENT_AMBULATORY_CARE_PROVIDER_SITE_OTHER): Payer: Medicare Other

## 2019-02-14 ENCOUNTER — Other Ambulatory Visit: Payer: Self-pay | Admitting: Internal Medicine

## 2019-02-14 DIAGNOSIS — Z1159 Encounter for screening for other viral diseases: Secondary | ICD-10-CM

## 2019-02-15 LAB — SARS CORONAVIRUS 2 (TAT 6-24 HRS): SARS Coronavirus 2: NEGATIVE

## 2019-02-18 ENCOUNTER — Other Ambulatory Visit: Payer: Self-pay

## 2019-02-18 ENCOUNTER — Encounter: Payer: Self-pay | Admitting: Internal Medicine

## 2019-02-18 ENCOUNTER — Ambulatory Visit (AMBULATORY_SURGERY_CENTER): Payer: Medicare Other | Admitting: Internal Medicine

## 2019-02-18 VITALS — BP 131/77 | HR 59 | Temp 97.1°F | Resp 14 | Ht 67.5 in | Wt 117.0 lb

## 2019-02-18 DIAGNOSIS — D123 Benign neoplasm of transverse colon: Secondary | ICD-10-CM | POA: Diagnosis not present

## 2019-02-18 DIAGNOSIS — K625 Hemorrhage of anus and rectum: Secondary | ICD-10-CM | POA: Diagnosis not present

## 2019-02-18 DIAGNOSIS — D124 Benign neoplasm of descending colon: Secondary | ICD-10-CM | POA: Diagnosis not present

## 2019-02-18 DIAGNOSIS — E785 Hyperlipidemia, unspecified: Secondary | ICD-10-CM | POA: Diagnosis not present

## 2019-02-18 DIAGNOSIS — Z8371 Family history of colonic polyps: Secondary | ICD-10-CM | POA: Diagnosis not present

## 2019-02-18 DIAGNOSIS — K635 Polyp of colon: Secondary | ICD-10-CM | POA: Insufficient documentation

## 2019-02-18 DIAGNOSIS — Z1211 Encounter for screening for malignant neoplasm of colon: Secondary | ICD-10-CM

## 2019-02-18 DIAGNOSIS — D122 Benign neoplasm of ascending colon: Secondary | ICD-10-CM

## 2019-02-18 DIAGNOSIS — R197 Diarrhea, unspecified: Secondary | ICD-10-CM | POA: Diagnosis not present

## 2019-02-18 DIAGNOSIS — D128 Benign neoplasm of rectum: Secondary | ICD-10-CM

## 2019-02-18 MED ORDER — SODIUM CHLORIDE 0.9 % IV SOLN: 500.0000 mL | Freq: Once | INTRAVENOUS | Status: DC

## 2019-02-18 NOTE — Op Note (Signed)
[facility] Patient Name: Patricia Dillon Procedure Date: 02/18/2019 12:54 PM MRN: [id number] Endoscopist: Jerene Bears , MD Age: 69 Referring MD:  Date of Birth: 06/03/1950 Gender: Female Account #: [id number] Procedure:                Colonoscopy Indications:              Colon cancer screening in patient at increased                            risk: Family history of 1st-degree relatives with                            colon polyps before age 41 years (son and                            daughter); last colonoscopy April 2012 for screening Medicines:                Monitored Anesthesia Care Procedure:                Pre-Anesthesia Assessment:                           - Prior to the procedure, a History and Physical                            was performed, and patient medications and                            allergies were reviewed. The patient's tolerance of                            previous anesthesia was also reviewed. The risks                            and benefits of the procedure and the sedation                            options and risks were discussed with the patient.                            All questions were answered, and informed consent                            was obtained. Prior Anticoagulants: The patient has                            taken no previous anticoagulant or antiplatelet                            agents except for aspirin. ASA Grade Assessment: II                            - A patient with mild systemic disease. After  reviewing the risks and benefits, the patient was                            deemed in satisfactory condition to undergo the                            procedure.                           After obtaining informed consent, the colonoscope                            was passed under direct vision. Throughout the                            procedure, the patient's blood pressure, pulse, and                            oxygen saturations were monitored continuously. The                            Colonoscope was introduced through the anus and                            advanced to the cecum, identified by appendiceal                            orifice and ileocecal valve. The colonoscopy was                            performed without difficulty. The patient tolerated                            the procedure well. The quality of the bowel                            preparation was excellent. The ileocecal valve,                            appendiceal orifice, and rectum were photographed. Scope In: 1:22:56 PM Scope Out: 1:42:33 PM Scope Withdrawal Time: 0 hours 15 minutes 27 seconds  Total Procedure Duration: 0 hours 19 minutes 37 seconds  Findings:                 The digital rectal exam was normal.                           Two sessile polyps were found in the ascending                            colon. The polyps were 3 to 7 mm in size. These                            polyps were removed with a cold snare. Resection  and retrieval were complete.                           A 9 mm polyp was found in the splenic flexure. The                            polyp was pedunculated. The polyp was removed with                            a hot snare. Resection and retrieval were complete.                           A 5 mm polyp was found in the descending colon. The                            polyp was sessile. The polyp was removed with a                            cold snare. Resection and retrieval were complete.                           A 2 mm polyp was found in the proximal rectum. The                            polyp was sessile. The polyp was removed with a                            cold snare. Resection and retrieval were complete.                           A few small-mouthed diverticula were found in the                            sigmoid colon.                            Internal hemorrhoids were found during retroflexion. Complications:            No immediate complications. Estimated Blood Loss:     Estimated blood loss was minimal. Impression:               - Two 3 to 7 mm polyps in the ascending colon,                            removed with a cold snare. Resected and retrieved.                           - One 9 mm polyp at the splenic flexure, removed                            with a hot snare. Resected and retrieved.                           -  One 5 mm polyp in the descending colon, removed                            with a cold snare. Resected and retrieved.                           - One 2 mm polyp in the proximal rectum, removed                            with a cold snare. Resected and retrieved.                           - Diverticulosis in the sigmoid colon.                           - Internal hemorrhoids. Recommendation:           - Patient has a contact number available for                            emergencies. The signs and symptoms of potential                            delayed complications were discussed with the                            patient. Return to normal activities tomorrow.                            Written discharge instructions were provided to the                            patient.                           - Resume previous diet.                           - Continue present medications.                           - No aspirin, ibuprofen, naproxen, or other                            non-steroidal anti-inflammatory drugs for 2 weeks                            after polyp removal.                           - Await pathology results.                           - Repeat colonoscopy is recommended for  surveillance. The colonoscopy date will be                            determined after pathology results from today's                            exam become available for  review. Jerene Bears, MD 02/18/2019 1:47:34 PM This report has been signed electronically.

## 2019-02-18 NOTE — Progress Notes (Signed)
[facility]- vitals

## 2019-02-18 NOTE — Patient Instructions (Signed)
Please, read all of the handouts given to you by your recovery room nurse.  Do not take NSAIDS: aspirin, aleve nor ibuprofen for two weeks to prevent bleeding.  Thank-you for choosing [place] for your healthcare needs today.   You have a handouts regarding hemorrhoidal banding.    YOU HAD AN ENDOSCOPIC PROCEDURE TODAY AT [facility]:   Refer to the procedure report that was given to you for any specific questions about what was found during the examination.  If the procedure report does not answer your questions, please call your gastroenterologist to clarify.  If you requested that your care partner not be given the details of your procedure findings, then the procedure report has been included in a sealed envelope for you to review at your convenience later.  YOU SHOULD EXPECT: Some feelings of bloating in the abdomen. Passage of more gas than usual.  Walking can help get rid of the air that was put into your GI tract during the procedure and reduce the bloating. If you had a lower endoscopy (such as a colonoscopy or flexible sigmoidoscopy) you may notice spotting of blood in your stool or on the toilet paper. If you underwent a bowel prep for your procedure, you may not have a normal bowel movement for a few days.  Please Note:  You might notice some irritation and congestion in your nose or some drainage.  This is from the oxygen used during your procedure.  There is no need for concern and it should clear up in a day or so.  SYMPTOMS TO REPORT IMMEDIATELY:   Following lower endoscopy (colonoscopy or flexible sigmoidoscopy):  Excessive amounts of blood in the stool  Significant tenderness or worsening of abdominal pains  Swelling of the abdomen that is new, acute  Fever of 100F or higher   For urgent or emergent issues, a gastroenterologist can be reached at any hour by calling [phone].   DIET:  We do recommend a small meal at first, but then you may proceed to your  regular diet.  Drink plenty of fluids but you should avoid alcoholic beverages for 24 hours.  ACTIVITY:  You should plan to take it easy for the rest of today and you should NOT DRIVE or use heavy machinery until tomorrow (because of the sedation medicines used during the test).    FOLLOW UP: Our staff will call the number listed on your records 48-72 hours following your procedure to check on you and address any questions or concerns that you may have regarding the information given to you following your procedure. If we do not reach you, we will leave a message.  We will attempt to reach you two times.  During this call, we will ask if you have developed any symptoms of COVID 19. If you develop any symptoms (ie: fever, flu-like symptoms, shortness of breath, cough etc.) before then, please call [phone].  If you test positive for Covid 19 in the 2 weeks post procedure, please call and report this information to [place].    If any biopsies were taken you will be contacted by phone or by letter within the next 1-3 weeks.  Please call us at [phone] if you have not heard about the biopsies in 3 weeks.    SIGNATURES/CONFIDENTIALITY: You and/or your care partner have signed paperwork which will be entered into your electronic medical record.  These signatures attest to the fact that that the information above on your After  Visit Summary has been reviewed and is understood.  Full responsibility of the confidentiality of this discharge information lies with you and/or your care-partner. 

## 2019-02-18 NOTE — Progress Notes (Signed)
Called to room to assist during endoscopic procedure.  Patient ID and intended procedure confirmed with present staff. Received instructions for my participation in the procedure from the performing physician.  

## 2019-02-18 NOTE — Progress Notes (Signed)
Report to PACU, RN, vss, BBS= Clear.  

## 2019-02-18 NOTE — Progress Notes (Signed)
Pt's states no medical or surgical changes since previsit or office visit. 

## 2019-02-21 ENCOUNTER — Telehealth: Payer: Self-pay

## 2019-02-21 NOTE — Telephone Encounter (Signed)
  Follow up Call-  Call back number 02/18/2019  Post procedure Call Back phone  # [phone]  Permission to leave phone message Yes  Some recent data might be hidden     No answer

## 2019-02-27 ENCOUNTER — Encounter: Payer: Self-pay | Admitting: Internal Medicine

## 2019-03-18 DIAGNOSIS — D2262 Melanocytic nevi of left upper limb, including shoulder: Secondary | ICD-10-CM | POA: Diagnosis not present

## 2019-03-18 DIAGNOSIS — D225 Melanocytic nevi of trunk: Secondary | ICD-10-CM | POA: Diagnosis not present

## 2019-03-18 DIAGNOSIS — L432 Lichenoid drug reaction: Secondary | ICD-10-CM | POA: Diagnosis not present

## 2019-03-18 DIAGNOSIS — L814 Other melanin hyperpigmentation: Secondary | ICD-10-CM | POA: Diagnosis not present

## 2019-03-18 DIAGNOSIS — D2272 Melanocytic nevi of left lower limb, including hip: Secondary | ICD-10-CM | POA: Diagnosis not present

## 2019-03-18 DIAGNOSIS — D1801 Hemangioma of skin and subcutaneous tissue: Secondary | ICD-10-CM | POA: Diagnosis not present

## 2019-03-18 DIAGNOSIS — D2271 Melanocytic nevi of right lower limb, including hip: Secondary | ICD-10-CM | POA: Diagnosis not present

## 2019-03-18 DIAGNOSIS — L84 Corns and callosities: Secondary | ICD-10-CM | POA: Diagnosis not present

## 2019-03-18 DIAGNOSIS — L720 Epidermal cyst: Secondary | ICD-10-CM | POA: Diagnosis not present

## 2019-03-18 DIAGNOSIS — D485 Neoplasm of uncertain behavior of skin: Secondary | ICD-10-CM | POA: Diagnosis not present

## 2019-03-18 DIAGNOSIS — L900 Lichen sclerosus et atrophicus: Secondary | ICD-10-CM | POA: Diagnosis not present

## 2019-03-21 ENCOUNTER — Telehealth: Payer: Self-pay | Admitting: Internal Medicine

## 2019-03-21 ENCOUNTER — Other Ambulatory Visit: Payer: Self-pay | Admitting: Internal Medicine

## 2019-03-21 DIAGNOSIS — E041 Nontoxic single thyroid nodule: Secondary | ICD-10-CM

## 2019-03-21 NOTE — Telephone Encounter (Signed)
Patient called stating Dr Darnell Level told her to get blood work and new thyroid ultrasound done before her next visit. She is wanting to get these set up. Please advise.

## 2019-03-21 NOTE — Telephone Encounter (Signed)
I put the order in for the thyroid ultrasound, but the thyroid test will check when she comes for her visit in April.

## 2019-04-01 ENCOUNTER — Encounter: Payer: Self-pay | Admitting: Internal Medicine

## 2019-04-09 ENCOUNTER — Ambulatory Visit
Admission: RE | Admit: 2019-04-09 | Discharge: 2019-04-09 | Disposition: A | Discharge status: Home / Self Care | Payer: Medicare Other | Source: Ambulatory Visit | Attending: Internal Medicine | Admitting: Internal Medicine

## 2019-04-09 DIAGNOSIS — E041 Nontoxic single thyroid nodule: Secondary | ICD-10-CM

## 2019-04-28 ENCOUNTER — Other Ambulatory Visit: Payer: Self-pay

## 2019-04-30 ENCOUNTER — Encounter: Payer: Self-pay | Admitting: Internal Medicine

## 2019-04-30 ENCOUNTER — Other Ambulatory Visit: Payer: Self-pay

## 2019-04-30 ENCOUNTER — Ambulatory Visit (INDEPENDENT_AMBULATORY_CARE_PROVIDER_SITE_OTHER): Payer: Medicare Other | Admitting: Internal Medicine

## 2019-04-30 VITALS — BP 138/80 | HR 65 | Ht 67.5 in | Wt 119.0 lb

## 2019-04-30 DIAGNOSIS — E041 Nontoxic single thyroid nodule: Secondary | ICD-10-CM

## 2019-04-30 LAB — TSH: TSH: 2.12 u[IU]/mL (ref 0.35–4.50)

## 2019-04-30 NOTE — Progress Notes (Signed)
Patient ID: Patricia Dillon, female   DOB: 07-14-1950, 69 y.o.   MRN: [id number]  This visit occurred during the SARS-CoV-2 public health emergency.  Safety protocols were in place, including screening questions prior to the visit, additional usage of staff PPE, and extensive cleaning of exam room while observing appropriate contact time as indicated for disinfecting solutions.   HPI  Patricia Dillon is a 69 y.o.-year-old female, initially referred by her PCP, Dr. Doug Sou, for management of thyroid nodule. She moved from [place] in 10/2013. Last visit 2 years ago.  Pt. has a history of a subcentimeter right thyroid nodule that was incidentally found on MRI of the neck in 2010.  It was followed by serial ultrasounds:  Reviewed the reports of her thyroid ultrasounds: 09/30/2008: 0.7 x 0.4 x 0.4 cm 05/07/2009: 0.56 x 0.36 x 0.47 cm 07/12/2010: 0.63 x 0.37 x 0.39 cm 10/27/2012: 0.72 x 0.55 x 0.71 cm - 2 hyperechoic areas that appear as microcalcifications. Thyroid gland was homogeneous on U/S.    05/10/2015: Thyroid ultrasound:  Right thyroid lobe: 40 x 13 x 14 mm.  - 9 x 7 x 8 mm solid nodule with coarse calcification, inferior pole (previously 7 x 6 x 7).  - 3 mm hypoechoic nodule, superior pole.  Left thyroid lobe: 42 x 9 x 11 mm.  - Two small colloid nodules less than 4 mm.  Isthmus Thickness: 1.4 mm. No nodules visualized.  Lymphadenopathy: None visualized.  IMPRESSION: 1. Small bilateral nodules as above. Findings do not meet current consensus criteria for biopsy. Follow-up by clinical exam is recommended.    Reviewing the images, nodule appears isoechoic, and the "coarse calcification" appears to be a colloid granules with comet tail sign (benign finding).  Since the nodule appeared to be low risk for cancer, I did not suggest biopsy.  04/09/2019: Thyroid ultrasound: Nodule # 1: Prior biopsy: No Location: Right; Inferior Maximum size: 1.2 cm; Other 2 dimensions: 0.9 x 0.9 cm,  previously, 0.9 x 0.7 x 0.8 cm Composition: mixed cystic and solid (1) Echogenicity: isoechoic (1) Echogenic foci: punctate echogenic foci (3) *Given size (>/= 1 - 1.4 cm) and appearance, a follow-up ultrasound in 1 year should be considered based on TI-RADS criteria.  IMPRESSION: 1. Normal-sized thyroid with solitary right nodule. Recommend annual/biennial ultrasound follow-up as above, until stability x5 years confirmed.    Reviewed patient's TFTs: 02/22/2017 (Dr. Marylynn Pearson): TSH 2.9 Lab Results  Component Value Date   TSH 2.900 07/11/2016   TSH 1.18 06/24/2015   TSH 1.300 04/09/2014   FREET4 1.13 07/11/2016  08/16/2012: TSH 2.480, fT4 1.23   Pt denies: - feeling nodules in neck - hoarseness - dysphagia - choking - SOB with lying down  Pt does not have a FH of thyroid ds. No FH of thyroid cancer. No h/o radiation tx to head or neck.  No recent contrast studies. No herbal supplements. No Biotin use. No recent steroids use.   She also has a history of Mitral regurgitation.  She also has a history of osteopenia.  She is on vitamin D.  She has lichen sclerosis >> will start Clobetasol cream.  ROS: Constitutional: no weight gain/no weight loss, no fatigue, no subjective hyperthermia, no subjective hypothermia Eyes: no blurry vision, no xerophthalmia ENT: no sore throat, + see HPI Cardiovascular: no CP/no SOB/no palpitations/no leg swelling Respiratory: no cough/no SOB/no wheezing Gastrointestinal: no N/no V/no D/no C/no acid reflux Musculoskeletal: no muscle aches/no joint aches Skin: no rashes, no hair  loss Neurological: no tremors/no numbness/no tingling/no dizziness  I reviewed pt's medications, allergies, PMH, social hx, family hx, and changes were documented in the history of present illness. Otherwise, unchanged from my initial visit note.  Past Medical History:  Diagnosis Date  . External hemorrhoids   . Hyperlipidemia   . Lichen sclerosus   .  Mitral valve regurgitation   . Osteopenia   . SVT (supraventricular tachycardia) (HCC)    Past Surgical History:  Procedure Laterality Date  . BREAST BIOPSY Left    benign  . BREAST EXCISIONAL BIOPSY Left    History   Social History  . Marital Status: Divorced    Spouse Name: N/A  . Number of Children: 2   Occupational History  . Scientist, water quality.   Social History Main Topics  . Smoking status: Never Smoker   . Smokeless tobacco: Not on file  . Alcohol Use: No  . Drug Use: No   Current Outpatient Medications on File Prior to Visit  Medication Sig Dispense Refill  . cholecalciferol (VITAMIN D) 1000 units tablet Take 1,000 Units by mouth daily.     No current facility-administered medications on file prior to visit.   Allergies  Allergen Reactions  . Vancomycin Itching  . Penicillins Rash   Family History  Problem Relation Age of Onset  . Hypertension Mother   . Heart disease Father   . Melanoma Daughter        age 38  . Colon polyps Daughter   . Colon polyps Son    PE: BP 138/80   Pulse 65   Ht 5' 7.5" (1.715 m)   Wt 119 lb (54 kg)   SpO2 98%   BMI 18.36 kg/m  Body mass index is 18.36 kg/m. Wt Readings from Last 3 Encounters:  04/30/19 119 lb (54 kg)  02/18/19 117 lb (53.1 kg)  01/15/19 117 lb 5 oz (53.2 kg)   Constitutional: normal weight, in NAD Eyes: PERRLA, EOMI, no exophthalmos ENT: moist mucous membranes, no thyromegaly, no cervical lymphadenopathy Cardiovascular: RRR, No MRG Respiratory: CTA B Gastrointestinal: abdomen soft, NT, ND, BS+ Musculoskeletal: no deformities, strength intact in all 4 Skin: moist, warm, no rashes Neurological: no tremor with outstretched hands, DTR normal in all 4  ASSESSMENT: 1. R Thyroid nodule - small  PLAN: 1.  Thyroid nodule -I reviewed the images of her 2014, 2017, 2021 thyroid ultrasounds along with the patient: On previous ultrasound, the right thyroid nodule was very small, without internal blood flow, more  wide than tall, and well delimited from the surrounding tissue.  It is isoechoic or mildly hypoechoic and has 2 colloid granules (rather than calcifications as initially characterized on the report).  This is a benign finding.  The nodule was stable in size over 8 years, from 2010-2017.  On the last ultrasound from 2021, the nodule appeared to have a cystic component and this is probably the reason why it appears larger.  The recommendation is to repeat ultrasound in a year.  We will do so, however, if this will appear stable, I doubt further investigation is necessary. -Also, she does not have a family history of thyroid cancer or personal history of radiation therapy to head and neck.  All of these will put her at lower risk for thyroid cancer. -She denies neck compression symptoms, and I again emphasized that she needs to let me know if she develops any neck compression symptoms -Latest TSH was reviewed and this was normal.  We will recheck  a level today. -I advised her to continue to get annual TSH levels by PCP -I will see her back in 1 year  Office Visit on 04/30/2019  Component Date Value Ref Range Status  . TSH 04/30/2019 2.12  0.35 - 4.50 uIU/mL Final   Normal TSH.  Philemon Kingdom, MD PhD [facility] Endocrinology

## 2019-04-30 NOTE — Patient Instructions (Signed)
Please stop at the lab.  Please return in 1 year.  

## 2019-05-23 ENCOUNTER — Encounter: Payer: Self-pay | Admitting: Internal Medicine

## 2019-06-06 ENCOUNTER — Other Ambulatory Visit: Payer: Self-pay | Admitting: Internal Medicine

## 2019-06-06 DIAGNOSIS — Z1231 Encounter for screening mammogram for malignant neoplasm of breast: Secondary | ICD-10-CM

## 2019-08-04 ENCOUNTER — Ambulatory Visit: Payer: Medicare Other

## 2019-08-08 ENCOUNTER — Ambulatory Visit
Admission: RE | Admit: 2019-08-08 | Discharge: 2019-08-08 | Disposition: A | Discharge status: Home / Self Care | Payer: Medicare Other | Source: Ambulatory Visit | Attending: Internal Medicine | Admitting: Internal Medicine

## 2019-08-08 ENCOUNTER — Other Ambulatory Visit: Payer: Self-pay

## 2019-08-08 DIAGNOSIS — Z1231 Encounter for screening mammogram for malignant neoplasm of breast: Secondary | ICD-10-CM | POA: Diagnosis not present

## 2019-08-10 ENCOUNTER — Encounter: Payer: Self-pay | Admitting: Internal Medicine

## 2019-09-16 DIAGNOSIS — H0100B Unspecified blepharitis left eye, upper and lower eyelids: Secondary | ICD-10-CM | POA: Diagnosis not present

## 2019-09-30 ENCOUNTER — Encounter: Payer: Self-pay | Admitting: Internal Medicine

## 2019-10-08 DIAGNOSIS — Z23 Encounter for immunization: Secondary | ICD-10-CM | POA: Diagnosis not present

## 2019-10-20 DIAGNOSIS — H2513 Age-related nuclear cataract, bilateral: Secondary | ICD-10-CM | POA: Diagnosis not present

## 2019-10-20 DIAGNOSIS — H0100A Unspecified blepharitis right eye, upper and lower eyelids: Secondary | ICD-10-CM | POA: Diagnosis not present

## 2019-10-20 DIAGNOSIS — H25013 Cortical age-related cataract, bilateral: Secondary | ICD-10-CM | POA: Diagnosis not present

## 2019-10-20 DIAGNOSIS — H524 Presbyopia: Secondary | ICD-10-CM | POA: Diagnosis not present

## 2019-10-23 DIAGNOSIS — L570 Actinic keratosis: Secondary | ICD-10-CM | POA: Diagnosis not present

## 2019-10-23 DIAGNOSIS — Z23 Encounter for immunization: Secondary | ICD-10-CM | POA: Diagnosis not present

## 2020-01-27 DIAGNOSIS — Z681 Body mass index (BMI) 19 or less, adult: Secondary | ICD-10-CM | POA: Diagnosis not present

## 2020-01-27 DIAGNOSIS — Z124 Encounter for screening for malignant neoplasm of cervix: Secondary | ICD-10-CM | POA: Diagnosis not present

## 2020-01-27 DIAGNOSIS — N760 Acute vaginitis: Secondary | ICD-10-CM | POA: Diagnosis not present

## 2020-02-03 DIAGNOSIS — Z20822 Contact with and (suspected) exposure to covid-19: Secondary | ICD-10-CM | POA: Diagnosis not present

## 2020-03-01 ENCOUNTER — Telehealth (INDEPENDENT_AMBULATORY_CARE_PROVIDER_SITE_OTHER): Payer: Medicare Other | Admitting: Family

## 2020-03-01 DIAGNOSIS — H9203 Otalgia, bilateral: Secondary | ICD-10-CM

## 2020-03-01 NOTE — Progress Notes (Signed)
  Patricia Dillon is a 70 y.o. female with the following history as recorded in [employer]:  Patient Active Problem List   Diagnosis Date Noted  . SOB (shortness of breath) 05/10/2018  . Viral URI 07/13/2017  . 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 Mitral valve regurgitation 06/26/2016  . Routine general medical examination at a health care facility 06/24/2015  . Thyroid nodule 04/09/2014  . Lichen sclerosus [id number]/41/9379  . Hyperlipidemia 12/30/2013  . Hx of cyst of breast 12/30/2013    Current Outpatient Medications  Medication Sig Dispense Refill  . cholecalciferol (VITAMIN D) 1000 units tablet Take 1,000 Units by mouth daily.     No current facility-administered medications for this visit.    Allergies: Vancomycin and Penicillins  Past Medical History:  Diagnosis Date  . External hemorrhoids   . Hyperlipidemia   . Lichen sclerosus   . Mitral valve regurgitation   . Osteopenia   . SVT (supraventricular tachycardia) (HCC)     Past Surgical History:  Procedure Laterality Date  . BREAST BIOPSY Left    benign  . BREAST EXCISIONAL BIOPSY Left     Family History  Problem Relation Age of Onset  . Hypertension Mother   . Heart disease Father   . Melanoma Daughter        age 79  . Colon polyps Daughter   . Colon polyps Son     Social History   Tobacco Use  . Smoking status: Never Smoker  . Smokeless tobacco: Never Used  Substance Use Topics  . Alcohol use: No    Alcohol/week: 0.0 standard drinks    Subjective:   I connected with Meryl Dare on 03/01/20 at  2:20 PM EST by a video enabled telemedicine application and verified that I am speaking with the correct person using two identifiers. Video did not work properly during phone call;    I discussed the limitations of evaluation and management by telemedicine and the availability of in person appointments. The patient expressed understanding and agreed to proceed. Provider in office/ patient is at home; provider and patient  are only 2 people on video call.   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  Lungs: Respirations unlabored;  Neurologic: Alert and oriented; speech intact;    Assessment:  1. Ear pain, bilateral     Plan:  Patient needs to be seen in person for complete evaluation; she has not seen her PCP in almost 1.5 years- will have her see Dr. Sharlet Salina at her earliest appointment since symptoms present for 5 weeks;  No concerns for COVID as patient had COVID at end of January;  Time spent 5 minutes  No follow-ups on file.  No orders of the defined types were placed in this encounter.   Requested Prescriptions    No prescriptions requested or ordered in this encounter

## 2020-03-03 ENCOUNTER — Telehealth: Payer: Medicare Other | Admitting: Internal Medicine

## 2020-03-03 ENCOUNTER — Ambulatory Visit (INDEPENDENT_AMBULATORY_CARE_PROVIDER_SITE_OTHER): Payer: Medicare Other | Admitting: Internal Medicine

## 2020-03-03 ENCOUNTER — Encounter: Payer: Self-pay | Admitting: Internal Medicine

## 2020-03-03 ENCOUNTER — Other Ambulatory Visit: Payer: Self-pay

## 2020-03-03 VITALS — BP 122/72 | HR 74 | Temp 98.2°F | Resp 18 | Ht 67.5 in | Wt 118.0 lb

## 2020-03-03 DIAGNOSIS — H9203 Otalgia, bilateral: Secondary | ICD-10-CM

## 2020-03-03 MED ORDER — NEOMYCIN-POLYMYXIN-HC 3.5-10000-1 OT SUSP: 3.0000 [drp] | Freq: Three times a day (TID) | OTIC | 0 refills | Status: DC

## 2020-03-03 NOTE — Patient Instructions (Signed)
We have sent in cortisporin ear drops to use 3 drops 3 times a day for 3 days.

## 2020-03-03 NOTE — Progress Notes (Signed)
   Subjective:   Patient ID: Patricia Dillon, female    DOB: February 09, 1950, 70 y.o.   MRN: [id number]  HPI The patient is a 70 YO female coming in for feeling of fullness/congestion and pain in the ears. 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 Denies sinus or allergy symptoms or sinus fullness or drainage. Denies fevers or chills. Denies cough or SOB. Overall stable and is off and on. Started some weeks ago. Non-smoker. No change to medications or diet.   Review of Systems  Constitutional: Negative.   HENT: Positive for ear pain.   Eyes: Negative.   Respiratory: Negative for cough, chest tightness and shortness of breath.   Cardiovascular: Negative for chest pain, palpitations and leg swelling.  Gastrointestinal: Negative for abdominal distention, abdominal pain, constipation, diarrhea, nausea and vomiting.  Musculoskeletal: Negative.   Skin: Negative.   Neurological: Negative.   Psychiatric/Behavioral: Negative.     Objective:  Physical Exam Constitutional:      Appearance: She is well-developed and well-nourished.  HENT:     Head: Normocephalic and atraumatic.     Right Ear: Tympanic membrane and ear canal normal. There is no impacted cerumen.     Left Ear: Tympanic membrane and ear canal normal. There is no impacted cerumen.     Ears:     Comments: Some pain with tugging on ears, no temporal tenderness Eyes:     Extraocular Movements: EOM normal.  Cardiovascular:     Rate and Rhythm: Normal rate and regular rhythm.  Pulmonary:     Effort: Pulmonary effort is normal. No respiratory distress.     Breath sounds: Normal breath sounds. No wheezing or rales.  Abdominal:     General: Bowel sounds are normal. There is no distension.     Palpations: Abdomen is soft.     Tenderness: There is no abdominal tenderness. There is no rebound.  Musculoskeletal:        General: No edema.     Cervical back: Normal range of motion.  Skin:    General: Skin is warm and dry.   Neurological:     Mental Status: She is alert and oriented to person, place, and time.     Coordination: Coordination normal.  Psychiatric:        Mood and Affect: Mood and affect normal.     Vitals:   03/03/20 0941  BP: 122/72  Pulse: 74  Resp: 18  Temp: 98.2 F (36.8 C)  TempSrc: Oral  SpO2: 97%  Weight: 118 lb (53.5 kg)  Height: 5' 7.5" (1.715 m)    This visit occurred during the SARS-CoV-2 public health emergency.  Safety protocols were in place, including screening questions prior to the visit, additional usage of staff PPE, and extensive cleaning of exam room while observing appropriate contact time as indicated for disinfecting solutions.   Assessment & Plan:

## 2020-03-04 DIAGNOSIS — H9209 Otalgia, unspecified ear: Secondary | ICD-10-CM | POA: Insufficient documentation

## 2020-03-04 NOTE — Assessment & Plan Note (Signed)
Rx cortisporin. If not effective would recommend zyrtec trial 1-2 weeks to see if this helps.

## 2020-03-17 DIAGNOSIS — H1032 Unspecified acute conjunctivitis, left eye: Secondary | ICD-10-CM | POA: Diagnosis not present

## 2020-03-21 DIAGNOSIS — J019 Acute sinusitis, unspecified: Secondary | ICD-10-CM | POA: Diagnosis not present

## 2020-03-21 DIAGNOSIS — B9689 Other specified bacterial agents as the cause of diseases classified elsewhere: Secondary | ICD-10-CM | POA: Diagnosis not present

## 2020-03-21 DIAGNOSIS — R059 Cough, unspecified: Secondary | ICD-10-CM | POA: Diagnosis not present

## 2020-03-29 ENCOUNTER — Encounter: Payer: Self-pay | Admitting: Internal Medicine

## 2020-04-26 DIAGNOSIS — H02055 Trichiasis without entropian left lower eyelid: Secondary | ICD-10-CM | POA: Diagnosis not present

## 2020-04-28 ENCOUNTER — Encounter: Payer: Self-pay | Admitting: Internal Medicine

## 2020-04-28 ENCOUNTER — Ambulatory Visit (INDEPENDENT_AMBULATORY_CARE_PROVIDER_SITE_OTHER): Payer: Medicare Other | Admitting: Internal Medicine

## 2020-04-28 ENCOUNTER — Other Ambulatory Visit: Payer: Self-pay

## 2020-04-28 VITALS — BP 138/88 | HR 63 | Ht 67.5 in | Wt 117.6 lb

## 2020-04-28 DIAGNOSIS — E041 Nontoxic single thyroid nodule: Secondary | ICD-10-CM

## 2020-04-28 NOTE — Progress Notes (Signed)
Patient ID: Patricia Dillon, female   DOB: 1950/05/30, 70 y.o.   MRN: [id number]  This visit occurred during the SARS-CoV-2 public health emergency.  Safety protocols were in place, including screening questions prior to the visit, additional usage of staff PPE, and extensive cleaning of exam room while observing appropriate contact time as indicated for disinfecting solutions.   HPI  Patricia Dillon is a 70 y.o.-year-old female, initially referred by her PCP, Dr. Doug Dillon, for management of thyroid nodule. She moved from [place] in 10/2013. Last visit 1 year ago.  Interim history: She had Covid19 in 02/2020 - mild form. She had ear pain and inflammation 1 mo ago >> given otic drops and then ABx. Also had enlarged lymph nodes in neck (this is recurrent for her).  She is taking care of her grandson who is in preschool.  She had a colonoscopy >> had several polyps removed.  Pt. has a history of a subcentimeter right thyroid nodule that was incidentally found on MRI of the neck in 2010  - after a fall.  It was followed by serial ultrasounds:  Reviewed the reports of her thyroid ultrasounds: 09/30/2008: 0.7 x 0.4 x 0.4 cm 05/07/2009: 0.56 x 0.36 x 0.47 cm 07/12/2010: 0.63 x 0.37 x 0.39 cm 10/27/2012: 0.72 x 0.55 x 0.71 cm - 2 hyperechoic areas that appear as microcalcifications. Thyroid gland was homogeneous on U/S.    05/10/2015: Thyroid ultrasound:  Right thyroid lobe: 40 x 13 x 14 mm.  - 9 x 7 x 8 mm solid nodule with coarse calcification, inferior pole (previously 7 x 6 x 7).  - 3 mm hypoechoic nodule, superior pole.  Left thyroid lobe: 42 x 9 x 11 mm.  - Two small colloid nodules less than 4 mm.  Isthmus Thickness: 1.4 mm. No nodules visualized.  Lymphadenopathy: None visualized.  IMPRESSION: 1. Small bilateral nodules as above. Findings do not meet current consensus criteria for biopsy. Follow-up by clinical exam is recommended.    Reviewing the images, nodule appears  isoechoic, and the "coarse calcification" appears to be a colloid granules with comet tail sign (benign finding).  Since the nodule appeared to be low risk for cancer, I did not suggest biopsy.  04/09/2019: Thyroid ultrasound: Nodule # 1: Prior biopsy: No Location: Right; Inferior Maximum size: 1.2 cm; Other 2 dimensions: 0.9 x 0.9 cm, previously, 0.9 x 0.7 x 0.8 cm Composition: mixed cystic and solid (1) Echogenicity: isoechoic (1) Echogenic foci: punctate echogenic foci (3) *Given size (>/= 1 - 1.4 cm) and appearance, a follow-up ultrasound in 1 year should be considered based on TI-RADS criteria.  IMPRESSION: 1. Normal-sized thyroid with solitary right nodule. Recommend annual/biennial ultrasound follow-up as above, until stability x5 years confirmed.    Reviewed patient's TFTs: Lab Results  Component Value Date   TSH 2.12 04/30/2019   TSH 2.900 07/11/2016   TSH 1.18 06/24/2015   TSH 1.300 04/09/2014   FREET4 1.13 07/11/2016  02/22/2017 (Dr. Marylynn Dillon): TSH 2.90 08/16/2012: TSH 2.480, fT4 1.23   Pt denies: - feeling nodules in neck - hoarseness - dysphagia - choking - SOB with lying down  Pt does not have a FH of thyroid ds. No FH of thyroid cancer. No h/o radiation tx to head or neck.  No recent contrast studies. No herbal supplements. No Biotin use. No recent steroids use.   She also has a history of Mitral regurgitation.  She also has a history of osteopenia.  She is on vitamin  D.  She has lichen sclerosis >> she has clobetasol cream, but only uses it as needed.  ROS: Constitutional: no weight gain/no weight loss, no fatigue, no subjective hyperthermia, no subjective hypothermia Eyes: no blurry vision, no xerophthalmia ENT: no sore throat, + see HPI Cardiovascular: no CP/no SOB/no palpitations/no leg swelling Respiratory: no cough/no SOB/no wheezing Gastrointestinal: no N/no V/no D/no C/no acid reflux Musculoskeletal: no muscle aches/no joint  aches Skin: no rashes, no hair loss Neurological: no tremors/no numbness/no tingling/no dizziness  I reviewed pt's medications, allergies, PMH, social hx, family hx, and changes were documented in the history of present illness. Otherwise, unchanged from my initial visit note.  Past Medical History:  Diagnosis Date  . External hemorrhoids   . Hyperlipidemia   . Lichen sclerosus   . Mitral valve regurgitation   . Osteopenia   . SVT (supraventricular tachycardia) (HCC)    Past Surgical History:  Procedure Laterality Date  . BREAST BIOPSY Left    benign  . BREAST EXCISIONAL BIOPSY Left    History   Social History  . Marital Status: Divorced    Spouse Name: N/A  . Number of Children: 2   Occupational History  . Scientist, water quality.   Social History Main Topics  . Smoking status: Never Smoker   . Smokeless tobacco: Not on file  . Alcohol Use: No  . Drug Use: No   Current Outpatient Medications on File Prior to Visit  Medication Sig Dispense Refill  . cholecalciferol (VITAMIN D) 1000 units tablet Take 1,000 Units by mouth daily.    . clobetasol cream (TEMOVATE) 7.10 % Apply 1 application topically as needed.    . neomycin-polymyxin-hydrocortisone (CORTISPORIN) 3.5-10000-1 OTIC suspension Place 3 drops into both ears 3 (three) times daily. 10 mL 0   No current facility-administered medications on file prior to visit.   Allergies  Allergen Reactions  . Vancomycin Itching  . Penicillins Rash   Family History  Problem Relation Age of Onset  . Hypertension Mother   . Heart disease Father   . Melanoma Daughter        age 38  . Colon polyps Daughter   . Colon polyps Son    PE: BP 138/88 (BP Location: Right Arm, Patient Position: Sitting, Cuff Size: Normal)   Pulse 63   Ht 5' 7.5" (1.715 m)   Wt 117 lb 9.6 oz (53.3 kg)   SpO2 97%   BMI 18.15 kg/m  Body mass index is 18.15 kg/m. Wt Readings from Last 3 Encounters:  05/07/20 115 lb 3.2 oz (52.3 kg)  04/28/20 117 lb 9.6 oz  (53.3 kg)  03/03/20 118 lb (53.5 kg)   Constitutional: normal weight, in NAD Eyes: PERRLA, EOMI, no exophthalmos ENT: moist mucous membranes, no thyromegaly and no nodules palpated, no cervical lymphadenopathy Cardiovascular: RRR, No MRG Respiratory: CTA B Gastrointestinal: abdomen soft, NT, ND, BS+ Musculoskeletal: no deformities, strength intact in all 4 Skin: moist, warm, no rashes Neurological: no tremor with outstretched hands, DTR normal in all 4  ASSESSMENT: 1. R Thyroid nodule - small  PLAN: 1.  Thyroid nodule -I reviewed the images and the reports of her 2014, 2017, and 2021 thyroid ultrasounds.  The right thyroid nodule was very small on the initial ultrasound, without internal blood flow, more wide than tall and well delimited from the surrounding tissue.  The nodule was isoechoic or mildly hypoechoic and had 2 colloid granules (rather than calcifications as initially characterized on the report).  This is a benign finding,  due to inspissated colloid.  The nodule is stable in size over 8 years, from 2010 to 2017.  On the latest ultrasound from 2021, the nodule appeared to have a larger colloid component and this was probably the reason why it appeared larger.  The recommendation was to repeat the ultrasound in a year. -At this visit, we discussed about repeating the ultrasound but if the nodule is stable, I do not feel that she needs further investigation -She does not have a family history of thyroid cancer or personal history of radiation therapy to head or neck to increase her risk of thyroid cancer -No neck compression symptoms.  I again emphasized that she needs to let me know if she develops any neck compression symptoms before next visit -Latest TSH was checked a year ago, at 2.12, normal. -She has an appointment coming up with PCP and I advised her to try to get another TSH checked at that time -We will repeat this today and, if normal, she can continue to get annual TSH  levels by PCP during her APEs -I will see her back on an as-needed basis in the future   Philemon Kingdom, MD PhD [facility] Endocrinology

## 2020-04-28 NOTE — Patient Instructions (Addendum)
Please ask Dr. Sharlet Salina to also check a TSH.  Let's check another thyroid ultrasound.  Please return as needed in the future.

## 2020-05-07 ENCOUNTER — Other Ambulatory Visit: Payer: Self-pay

## 2020-05-07 ENCOUNTER — Ambulatory Visit (INDEPENDENT_AMBULATORY_CARE_PROVIDER_SITE_OTHER): Payer: Medicare Other | Admitting: Internal Medicine

## 2020-05-07 ENCOUNTER — Encounter: Payer: Self-pay | Admitting: Internal Medicine

## 2020-05-07 VITALS — BP 122/60 | HR 72 | Temp 98.9°F | Resp 18 | Ht 67.5 in | Wt 115.2 lb

## 2020-05-07 DIAGNOSIS — R238 Other skin changes: Secondary | ICD-10-CM | POA: Insufficient documentation

## 2020-05-07 DIAGNOSIS — Z0000 Encounter for general adult medical examination without abnormal findings: Secondary | ICD-10-CM | POA: Diagnosis not present

## 2020-05-07 DIAGNOSIS — E041 Nontoxic single thyroid nodule: Secondary | ICD-10-CM | POA: Diagnosis not present

## 2020-05-07 DIAGNOSIS — E538 Deficiency of other specified B group vitamins: Secondary | ICD-10-CM | POA: Diagnosis not present

## 2020-05-07 DIAGNOSIS — E559 Vitamin D deficiency, unspecified: Secondary | ICD-10-CM

## 2020-05-07 DIAGNOSIS — R0602 Shortness of breath: Secondary | ICD-10-CM | POA: Diagnosis not present

## 2020-05-07 DIAGNOSIS — E782 Mixed hyperlipidemia: Secondary | ICD-10-CM | POA: Diagnosis not present

## 2020-05-07 LAB — COMPREHENSIVE METABOLIC PANEL
ALT: 23 U/L (ref 0–35)
AST: 29 U/L (ref 0–37)
Albumin: 4.6 g/dL (ref 3.5–5.2)
Alkaline Phosphatase: 108 U/L (ref 39–117)
BUN: 19 mg/dL (ref 6–23)
CO2: 31 mEq/L (ref 19–32)
Calcium: 9.6 mg/dL (ref 8.4–10.5)
Chloride: 103 mEq/L (ref 96–112)
Creatinine, Ser: 0.9 mg/dL (ref 0.40–1.20)
GFR: 65.72 mL/min (ref >60.00)
Glucose, Bld: 100 mg/dL — ABNORMAL HIGH (ref 70–99)
Potassium: 4.2 mEq/L (ref 3.5–5.1)
Sodium: 141 mEq/L (ref 135–145)
Total Bilirubin: 1 mg/dL (ref 0.2–1.2)
Total Protein: 7.7 g/dL (ref 6.0–8.3)

## 2020-05-07 LAB — CBC
HCT: 40.9 % (ref 36.0–46.0)
Hemoglobin: 13.6 g/dL (ref 12.0–15.0)
MCHC: 33.2 g/dL (ref 30.0–36.0)
MCV: 92.2 fl (ref 78.0–100.0)
Platelets: 130 10*3/uL — ABNORMAL LOW (ref 150.0–400.0)
RBC: 4.43 Mil/uL (ref 3.87–5.11)
RDW: 13.7 % (ref 11.5–15.5)
WBC: 5.8 10*3/uL (ref 4.0–10.5)

## 2020-05-07 LAB — VITAMIN B12: Vitamin B-12: 106 pg/mL — ABNORMAL LOW (ref 211–911)

## 2020-05-07 LAB — LIPID PANEL
Cholesterol: 214 mg/dL — ABNORMAL HIGH (ref 0–200)
HDL: 73.1 mg/dL (ref >39.00)
LDL Cholesterol: 121 mg/dL — ABNORMAL HIGH (ref 0–99)
NonHDL: 140.41
Total CHOL/HDL Ratio: 3
Triglycerides: 95 mg/dL (ref 0.0–149.0)
VLDL: 19 mg/dL (ref 0.0–40.0)

## 2020-05-07 LAB — T4, FREE: Free T4: 0.63 ng/dL (ref 0.60–1.60)

## 2020-05-07 LAB — TSH: TSH: 2.17 u[IU]/mL (ref 0.35–4.50)

## 2020-05-07 LAB — VITAMIN D 25 HYDROXY (VIT D DEFICIENCY, FRACTURES): VITD: 36.04 ng/mL (ref 30.00–100.00)

## 2020-05-07 NOTE — Progress Notes (Signed)
Subjective:   Patient ID: Patricia Dillon, female    DOB: Sep 18, 1950, 70 y.o.   MRN: [id number]  HPI Here for medicare wellness, with new complaints. Please see A/P for status and treatment of chronic medical problems.   HPI #2: Here for new color change right thumb (some grey to bruising color on the top of the thumb, sometimes it seems to get cold, non-smoker, denies pain, some bruising on the hand also which just fades within a day or two of coming up, this started about 2 months ago) and follow up cholesterol (not on meds, no labs in some years for this, is active but no formal exercise, denies chest pains or stroke symptoms) and thyroid nodule (seeing endo yearly, just saw them, needs labs, they ordered US, she denies new heat or cold intolerance, denies new problems with swallowing).   Diet: heart healthy  Physical activity: active Depression/mood screen: negative Hearing: intact to whispered voice Visual acuity: grossly normal with lens, performs annual eye exam  ADLs: capable Fall risk: none Home safety: good Cognitive evaluation: intact to orientation, naming, recall and repetition EOL planning: adv directives discussed  [place] Visit from 03/03/2020 in [place] [facility] Total Score 0      I have personally reviewed and have noted 1. The patient's medical and social history - reviewed today no changes 2. Their use of alcohol, tobacco or illicit drugs 3. Their current medications and supplements 4. The patient's functional ability including ADL's, fall risks, home safety risks and hearing or visual impairment. 5. Diet and physical activities 6. Evidence for depression or mood disorders 7. Care team reviewed and updated 8.  The patient is not on an opioid pain medication.  Patient Care Team: Hoyt Koch, MD as PCP - General (Internal Medicine) Past Medical History:  Diagnosis Date  . External hemorrhoids   . Hyperlipidemia    . Lichen sclerosus   . Mitral valve regurgitation   . Osteopenia   . SVT (supraventricular tachycardia) (HCC)    Past Surgical History:  Procedure Laterality Date  . BREAST BIOPSY Left    benign  . BREAST EXCISIONAL BIOPSY Left    Family History  Problem Relation Age of Onset  . Hypertension Mother   . Heart disease Father   . Melanoma Daughter        age 6  . Colon polyps Daughter   . Colon polyps Son    Review of Systems  Constitutional: Negative.   HENT: Negative.   Eyes: Negative.   Respiratory: Negative for cough, chest tightness and shortness of breath.   Cardiovascular: Negative for chest pain, palpitations and leg swelling.  Gastrointestinal: Negative for abdominal distention, abdominal pain, constipation, diarrhea, nausea and vomiting.  Endocrine: Negative.   Musculoskeletal: Negative.   Skin: Positive for color change.  Neurological: Negative.   Psychiatric/Behavioral: Negative.     Objective:  Physical Exam Constitutional:      Appearance: She is well-developed.  HENT:     Head: Normocephalic and atraumatic.  Cardiovascular:     Rate and Rhythm: Normal rate and regular rhythm.  Pulmonary:     Effort: Pulmonary effort is normal. No respiratory distress.     Breath sounds: Normal breath sounds. No wheezing or rales.  Abdominal:     General: Bowel sounds are normal. There is no distension.     Palpations: Abdomen is soft.     Tenderness: There is no abdominal tenderness. There is  no rebound.  Musculoskeletal:     Cervical back: Normal range of motion.  Skin:    General: Skin is warm and dry.     Findings: Bruising present.     Comments: Some bruising on the top half of the right thumb, cap refill normal, pulses in the wrist normal, no tenderness to palpation and thumb feels warm to touch  Neurological:     Mental Status: She is alert and oriented to person, place, and time.     Coordination: Coordination normal.     Vitals:   05/07/20 1037   BP: 122/60  Pulse: 72  Resp: 18  Temp: 98.9 F (37.2 C)  TempSrc: Oral  SpO2: 97%  Weight: 115 lb 3.2 oz (52.3 kg)  Height: 5' 7.5" (1.715 m)   This visit occurred during the SARS-CoV-2 public health emergency.  Safety protocols were in place, including screening questions prior to the visit, additional usage of staff PPE, and extensive cleaning of exam room while observing appropriate contact time as indicated for disinfecting solutions.   Assessment & Plan:

## 2020-05-07 NOTE — Assessment & Plan Note (Signed)
Checking CBC to rule out change in cell count or function. She has adequate pulses on exam and is non-smoker. If no etiology detected could do auto-immune work up.

## 2020-05-07 NOTE — Assessment & Plan Note (Signed)
Doing well and managing with deep breathing exercises.

## 2020-05-07 NOTE — Assessment & Plan Note (Signed)
Flu shot yearly. Covid-19 3 shots counseled about 4th. Pneumonia counseled. Shingrix counseled to get at pharmacy. Tetanus due 2026. Colonoscopy due 2024. Mammogram due 2023, pap smear aged out and dexa due declines. Counseled about sun safety and mole surveillance. Counseled about the dangers of distracted driving. Given 10 year screening recommendations.

## 2020-05-07 NOTE — Assessment & Plan Note (Signed)
Checking lipid panel and adjust as needed.  

## 2020-05-07 NOTE — Patient Instructions (Signed)
Health Maintenance, Female Adopting a healthy lifestyle and getting preventive care are important in promoting health and wellness. Ask your health care provider about:  The right schedule for you to have regular tests and exams.  Things you can do on your own to prevent diseases and keep yourself healthy. What should I know about diet, weight, and exercise? Eat a healthy diet  Eat a diet that includes plenty of vegetables, fruits, low-fat dairy products, and lean protein.  Do not eat a lot of foods that are high in solid fats, added sugars, or sodium.   Maintain a healthy weight Body mass index (BMI) is used to identify weight problems. It estimates body fat based on height and weight. Your health care provider can help determine your BMI and help you achieve or maintain a healthy weight. Get regular exercise Get regular exercise. This is one of the most important things you can do for your health. Most adults should:  Exercise for at least 150 minutes each week. The exercise should increase your heart rate and make you sweat (moderate-intensity exercise).  Do strengthening exercises at least twice a week. This is in addition to the moderate-intensity exercise.  Spend less time sitting. Even light physical activity can be beneficial. Watch cholesterol and blood lipids Have your blood tested for lipids and cholesterol at 69 years of age, then have this test every 5 years. Have your cholesterol levels checked more often if:  Your lipid or cholesterol levels are high.  You are older than 69 years of age.  You are at high risk for heart disease. What should I know about cancer screening? Depending on your health history and family history, you may need to have cancer screening at various ages. This may include screening for:  Breast cancer.  Cervical cancer.  Colorectal cancer.  Skin cancer.  Lung cancer. What should I know about heart disease, diabetes, and high blood  pressure? Blood pressure and heart disease  High blood pressure causes heart disease and increases the risk of stroke. This is more likely to develop in people who have high blood pressure readings, are of African descent, or are overweight.  Have your blood pressure checked: ? Every 3-5 years if you are 18-39 years of age. ? Every year if you are 40 years old or older. Diabetes Have regular diabetes screenings. This checks your fasting blood sugar level. Have the screening done:  Once every three years after age 40 if you are at a normal weight and have a low risk for diabetes.  More often and at a younger age if you are overweight or have a high risk for diabetes. What should I know about preventing infection? Hepatitis B If you have a higher risk for hepatitis B, you should be screened for this virus. Talk with your health care provider to find out if you are at risk for hepatitis B infection. Hepatitis C Testing is recommended for:  Everyone born from 1945 through 1965.  Anyone with known risk factors for hepatitis C. Sexually transmitted infections (STIs)  Get screened for STIs, including gonorrhea and chlamydia, if: ? You are sexually active and are younger than 69 years of age. ? You are older than 69 years of age and your health care provider tells you that you are at risk for this type of infection. ? Your sexual activity has changed since you were last screened, and you are at increased risk for chlamydia or gonorrhea. Ask your health care provider   if you are at risk.  Ask your health care provider about whether you are at high risk for HIV. Your health care provider may recommend a prescription medicine to help prevent HIV infection. If you choose to take medicine to prevent HIV, you should first get tested for HIV. You should then be tested every 3 months for as long as you are taking the medicine. Pregnancy  If you are about to stop having your period (premenopausal) and  you may become pregnant, seek counseling before you get pregnant.  Take 400 to 800 micrograms (mcg) of folic acid every day if you become pregnant.  Ask for birth control (contraception) if you want to prevent pregnancy. Osteoporosis and menopause Osteoporosis is a disease in which the bones lose minerals and strength with aging. This can result in bone fractures. If you are 65 years old or older, or if you are at risk for osteoporosis and fractures, ask your health care provider if you should:  Be screened for bone loss.  Take a calcium or vitamin D supplement to lower your risk of fractures.  Be given hormone replacement therapy (HRT) to treat symptoms of menopause. Follow these instructions at home: Lifestyle  Do not use any products that contain nicotine or tobacco, such as cigarettes, e-cigarettes, and chewing tobacco. If you need help quitting, ask your health care provider.  Do not use street drugs.  Do not share needles.  Ask your health care provider for help if you need support or information about quitting drugs. Alcohol use  Do not drink alcohol if: ? Your health care provider tells you not to drink. ? You are pregnant, may be pregnant, or are planning to become pregnant.  If you drink alcohol: ? Limit how much you use to 0-1 drink a day. ? Limit intake if you are breastfeeding.  Be aware of how much alcohol is in your drink. In the [place] one drink equals one 12 oz bottle of beer (355 mL), one 5 oz glass of wine (148 mL), or one 1 oz glass of hard liquor (44 mL). General instructions  Schedule regular health, dental, and eye exams.  Stay current with your vaccines.  Tell your health care provider if: ? You often feel depressed. ? You have ever been abused or do not feel safe at home. Summary  Adopting a healthy lifestyle and getting preventive care are important in promoting health and wellness.  Follow your health care provider's instructions about healthy  diet, exercising, and getting tested or screened for diseases.  Follow your health care provider's instructions on monitoring your cholesterol and blood pressure. This information is not intended to replace advice given to you by your health care provider. Make sure you discuss any questions you have with your health care provider. Document Revised: 12/12/2017 Document Reviewed: 12/12/2017 Elsevier Patient Education  2021 Elsevier Inc.  

## 2020-05-07 NOTE — Assessment & Plan Note (Signed)
Checking TSH and free T4 and adjust as needed. Getting [place] thryoid from endo.

## 2020-05-10 ENCOUNTER — Encounter: Payer: Self-pay | Admitting: Internal Medicine

## 2020-05-12 ENCOUNTER — Other Ambulatory Visit: Payer: Medicare Other

## 2020-05-18 ENCOUNTER — Encounter: Payer: Self-pay | Admitting: Internal Medicine

## 2020-06-07 ENCOUNTER — Ambulatory Visit
Admission: RE | Admit: 2020-06-07 | Discharge: 2020-06-07 | Disposition: A | Discharge status: Home / Self Care | Payer: Medicare Other | Source: Ambulatory Visit | Attending: Internal Medicine | Admitting: Internal Medicine

## 2020-06-07 DIAGNOSIS — E041 Nontoxic single thyroid nodule: Secondary | ICD-10-CM | POA: Diagnosis not present

## 2020-06-08 DIAGNOSIS — L905 Scar conditions and fibrosis of skin: Secondary | ICD-10-CM | POA: Diagnosis not present

## 2020-06-08 DIAGNOSIS — L814 Other melanin hyperpigmentation: Secondary | ICD-10-CM | POA: Diagnosis not present

## 2020-06-08 DIAGNOSIS — L900 Lichen sclerosus et atrophicus: Secondary | ICD-10-CM | POA: Diagnosis not present

## 2020-06-08 DIAGNOSIS — D1801 Hemangioma of skin and subcutaneous tissue: Secondary | ICD-10-CM | POA: Diagnosis not present

## 2020-06-08 DIAGNOSIS — L438 Other lichen planus: Secondary | ICD-10-CM | POA: Diagnosis not present

## 2020-06-21 ENCOUNTER — Other Ambulatory Visit: Payer: Self-pay

## 2020-06-21 ENCOUNTER — Encounter: Payer: Self-pay | Admitting: Internal Medicine

## 2020-06-21 ENCOUNTER — Ambulatory Visit (INDEPENDENT_AMBULATORY_CARE_PROVIDER_SITE_OTHER): Payer: Medicare Other | Admitting: Internal Medicine

## 2020-06-21 VITALS — BP 132/78 | HR 83 | Temp 97.9°F | Ht 67.5 in | Wt 115.6 lb

## 2020-06-21 DIAGNOSIS — R0789 Other chest pain: Secondary | ICD-10-CM | POA: Insufficient documentation

## 2020-06-21 DIAGNOSIS — E538 Deficiency of other specified B group vitamins: Secondary | ICD-10-CM

## 2020-06-21 NOTE — Patient Instructions (Addendum)
Try nutritional yeast for a B12 supplement.   We will plan to do 3 B12 shots more over the next month or two and then recheck the B12 levels end of August.

## 2020-06-21 NOTE — Assessment & Plan Note (Addendum)
Given B12 shot today and advised every 2 weeks for total 4 shots then can try sublingual B12 to see if she can tolerate that. B12 lab order placed to be done mid to late August after B12 shots.

## 2020-06-21 NOTE — Progress Notes (Signed)
   Subjective:   Patient ID: Patricia Dillon, female    DOB: Jun 13, 1950, 70 y.o.   MRN: [id number]  HPI The patient is a 70 YO female coming in for abdominal pain (left upper abdomen/lower chest going on about a month or so, has a lot of digestive issues and thought it was related to this, more often after eating and some more heart burn) and questions about B12 (she had low B12 level at physical and has started oral B12 supplement, got reaction to it so took about 1-2 weeks, not taking now, some tingling in the legs random).   Review of Systems  Constitutional: Negative.   HENT: Negative.    Eyes: Negative.   Respiratory:  Positive for chest tightness. Negative for cough and shortness of breath.   Cardiovascular:  Negative for chest pain, palpitations and leg swelling.  Gastrointestinal:  Positive for abdominal pain. Negative for abdominal distention, constipation, diarrhea, nausea and vomiting.       Gerd  Musculoskeletal: Negative.   Skin: Negative.   Neurological: Negative.   Psychiatric/Behavioral: Negative.     Objective:  Physical Exam Constitutional:      Appearance: She is well-developed.  HENT:     Head: Normocephalic and atraumatic.  Cardiovascular:     Rate and Rhythm: Normal rate and regular rhythm.  Pulmonary:     Effort: Pulmonary effort is normal. No respiratory distress.     Breath sounds: Normal breath sounds. No wheezing or rales.  Abdominal:     General: Bowel sounds are normal. There is no distension.     Palpations: Abdomen is soft.     Tenderness: There is no abdominal tenderness. There is no rebound.  Musculoskeletal:     Cervical back: Normal range of motion.  Skin:    General: Skin is warm and dry.  Neurological:     Mental Status: She is alert and oriented to person, place, and time.     Coordination: Coordination normal.    Vitals:   06/21/20 0817  BP: 132/78  Pulse: 83  Temp: 97.9 F (36.6 C)  TempSrc: Oral  SpO2: 98%  Weight: 115 lb 9.6  oz (52.4 kg)  Height: 5' 7.5" (1.715 m)   EKG: Rate 60, axis normal, interval normal, sinus, chronic st or t wave changes V1 V2, no significant change compared to prior 2020   This visit occurred during the SARS-CoV-2 public health emergency.  Safety protocols were in place, including screening questions prior to the visit, additional usage of staff PPE, and extensive cleaning of exam room while observing appropriate contact time as indicated for disinfecting solutions.   Assessment & Plan:  B12 shot given at visit

## 2020-06-21 NOTE — Assessment & Plan Note (Signed)
Sounds to be more digestive in nature. EKG done today which was not changed from 2020. Up to date on colonoscopy. Has had endoscopy long ago.

## 2020-06-22 MED ORDER — CYANOCOBALAMIN 1000 MCG/ML IJ SOLN
1000.0000 ug | Freq: Once | INTRAMUSCULAR | Status: AC
  Administered 2020-06-21: 1000 ug via INTRAMUSCULAR

## 2020-06-22 NOTE — Addendum Note (Signed)
Addended by: Hinda Kehr on: 06/22/2020 08:03 AM   Modules accepted: Orders

## 2020-06-24 ENCOUNTER — Other Ambulatory Visit: Payer: Self-pay | Admitting: Internal Medicine

## 2020-06-24 DIAGNOSIS — Z1231 Encounter for screening mammogram for malignant neoplasm of breast: Secondary | ICD-10-CM

## 2020-06-29 DIAGNOSIS — H02055 Trichiasis without entropian left lower eyelid: Secondary | ICD-10-CM | POA: Diagnosis not present

## 2020-07-06 ENCOUNTER — Telehealth: Payer: Self-pay | Admitting: Internal Medicine

## 2020-07-06 NOTE — Telephone Encounter (Signed)
See below

## 2020-07-06 NOTE — Telephone Encounter (Signed)
   Patient called and said that she is having frequent bowel movements since her first b12. She was wondering if she needed to postpone her second shot that is scheduled for 7/8. She can be reached at [phone]. Please advise

## 2020-07-06 NOTE — Telephone Encounter (Signed)
This would not be a typical reaction of B12 but can delay a week or two if desired.

## 2020-07-07 NOTE — Telephone Encounter (Signed)
Patient made aware of Dr Charlynne Cousins response

## 2020-07-09 ENCOUNTER — Ambulatory Visit: Payer: Medicare Other

## 2020-07-26 ENCOUNTER — Ambulatory Visit (INDEPENDENT_AMBULATORY_CARE_PROVIDER_SITE_OTHER): Payer: Medicare Other

## 2020-07-26 ENCOUNTER — Other Ambulatory Visit: Payer: Self-pay

## 2020-07-26 DIAGNOSIS — E538 Deficiency of other specified B group vitamins: Secondary | ICD-10-CM

## 2020-07-26 MED ORDER — CYANOCOBALAMIN 1000 MCG/ML IJ SOLN
1000.0000 ug | INTRAMUSCULAR | Status: AC
  Administered 2020-07-26 – 2020-08-23 (×2): 1000 ug via INTRAMUSCULAR

## 2020-07-26 NOTE — Progress Notes (Signed)
Pt here for biweekly B12 injection #2 of 4 per Dr Sharlet Salina.  B12 1058mg given IM in right deltoid and pt tolerated injection well.  Next B12 injection scheduled for 08/09/20.

## 2020-08-04 ENCOUNTER — Ambulatory Visit: Payer: Medicare Other | Admitting: Nurse Practitioner

## 2020-08-06 ENCOUNTER — Ambulatory Visit (INDEPENDENT_AMBULATORY_CARE_PROVIDER_SITE_OTHER): Payer: Medicare Other | Admitting: Nurse Practitioner

## 2020-08-06 ENCOUNTER — Other Ambulatory Visit: Payer: Self-pay

## 2020-08-06 ENCOUNTER — Encounter: Payer: Self-pay | Admitting: Nurse Practitioner

## 2020-08-06 VITALS — BP 150/90 | HR 68 | Ht 66.25 in | Wt 116.1 lb

## 2020-08-06 DIAGNOSIS — K648 Other hemorrhoids: Secondary | ICD-10-CM | POA: Diagnosis not present

## 2020-08-06 DIAGNOSIS — K625 Hemorrhage of anus and rectum: Secondary | ICD-10-CM

## 2020-08-06 MED ORDER — HYDROCORTISONE (PERIANAL) 2.5 % EX CREA: 1.0000 "application " | TOPICAL_CREAM | Freq: Every day | CUTANEOUS | 1 refills | Status: AC

## 2020-08-06 NOTE — Patient Instructions (Addendum)
If you are age 69 or older, your body mass index should be between 23-30. Your Body mass index is 18.6 kg/m. If this is out of the aforementioned range listed, please consider follow up with your Primary Care Provider. __________________________________________________________  The [facility] providers would like to encourage you to use [facility] to communicate with providers for non-urgent requests or questions.  Due to long hold times on the telephone, sending your provider a message by [facility] may be a faster and more efficient way to get a response.  Please allow 48 business hours for a response.  Please remember that this is for non-urgent requests.   We have sent the following medications to your pharmacy for you to pick up at your convenience:  START: Anusol cream per rectum daily at bedtime for 10 days.  Please call our office or send a MyChart message in 2 weeks to give [place] an update on how you are feeling.  Thank you for entrusting me with your care and choosing [facility].  Tye Savoy, NP

## 2020-08-06 NOTE — Progress Notes (Signed)
ASSESSMENT AND PLAN    #70 year old female with longstanding history of internal hemorrhoids.  Here with perianal itching, scant rectal bleeding and mucus discharge from rectum.  She inquires about internal hemorrhoid banding as discussed with Dr. Hilarie Fredrickson at time of her colonoscopy.  She has a small external hemorrhoid on exam.  No protruding internal hemorrhoids at this time.  Anoscopy not done -- Trial of Anusol cream per rectum at night x10 days --Patient will call with a condition update in 10 to 14 days.  If no improvement in her symptoms then we will arrange an appointment with Dr. Hilarie Fredrickson for internal hemorrhoid banding -- Overall her bowel movements seem to be okay.  No excessive straining or hard stools.  She is sensitive to fiber which causes loose stools.  Not adding anything to her bowel regimen at this time  #History of multiple tubular adenomas of the colon February 2021.  Due for polyp surveillance colonoscopy February 2024  HISTORY OF PRESENT ILLNESS    Chief Complaint : hemorrhoids  Patricia Dillon is a 70 y.o. female known to Dr. Hilarie Fredrickson with a past medical history significant for adenomatous colon polyps, lichen sclerosus, internal hemorrhoids, osteopenia, hyperlipidemia, SVT . See PMH below for any additional medical problems.    Patricia Dillon comes in to talk about hemorrhoid banding or other hemorrhoid treatment . She is having perianal itching, scant rectal bleeding and mucous discharge from rectum.  She is interested in internal hemorrhoid banding as discussed with Dr. Hilarie Fredrickson at time of colonoscopy.  She wants to know if there is anything to be tried prior to banding . She isn't constipated. First BM of day is soft. stools progressively getting looser over the course of about three bowel movements. She is sensitive to fiber which causes loose stool so she tries not to eat too much fiber. Drinks plenty of water.   She has lichen planus of vagina and uses topical steroid cream as  needed  PREVIOUS ENDOSCOPIC EVALUATIONS / PERTINENT STUDIES:   February 2021 screening colonoscopy.  Family history of first-degree relatives with colon polyps before the age of 88. -- Two 3 to 7 mm polyps removed from the ascending colon.  A 9 mm polyp removed with a hepatics flexure.  A 5 mm polyp removed from the descending colon.  A 2 mm polyp removed from the proximal rectum.  Sigmoid diverticulosis.  Internal hemorrhoids    Past Medical History:  Diagnosis Date   External hemorrhoids    Hyperlipidemia    Lichen sclerosus    Mitral valve regurgitation    Osteopenia    SVT (supraventricular tachycardia) (HCC)    Vitamin B12 deficiency     Current Medications, Allergies, Past Surgical History, Family History and Social History were reviewed in [employer] record.   Current Outpatient Medications  Medication Sig Dispense Refill   betamethasone dipropionate (DIPROLENE) 0.05 % ointment Apply topically.     cholecalciferol (VITAMIN D) 1000 units tablet Take 1,000 Units by mouth daily.     clobetasol cream (TEMOVATE) [id number] % Apply 1 application topically as needed.     Cyanocobalamin (VITAMIN B-12 IJ) Inject 1,000 mcg as directed every 14 (fourteen) days.     Current Facility-Administered Medications  Medication Dose Route Frequency Provider Last Rate Last Admin   cyanocobalamin ((VITAMIN B-12)) injection 1,000 mcg  1,000 mcg Intramuscular Q14 Days Hoyt Koch, MD   1,000 mcg at 07/26/20 [id number]    Review of Systems: No chest  pain. No shortness of breath. No urinary complaints.   PHYSICAL EXAM :    Wt Readings from Last 3 Encounters:  08/06/20 116 lb 2 oz (52.7 kg)  06/21/20 115 lb 9.6 oz (52.4 kg)  05/07/20 115 lb 3.2 oz (52.3 kg)    BP (!) 150/90 (BP Location: Left Arm, Patient Position: Sitting, Cuff Size: Normal)   Pulse 68   Ht 5' 6.25" (1.683 m) Comment: height measured without shoes  Wt 116 lb 2 oz (52.7 kg)   BMI 18.60 kg/m   Constitutional:  Pleasant female in no acute distress. Psychiatric: Normal mood and affect. Behavior is normal. EENT: Pupils normal.  Conjunctivae are normal. No scleral icterus. Neck supple.  Cardiovascular: Normal rate, regular rhythm. No edema Pulmonary/chest: Effort normal and breath sounds normal. No wheezing, rales or rhonchi. Abdominal: Soft, nondistended, nontender. Bowel sounds active throughout. There are no masses palpable. No hepatomegaly. Rectal: small external hemorrhoid. No lesions felt on DRE.  Neurological: Alert and oriented to person place and time. Skin: Skin is warm and dry. No rashes noted.  Tye Savoy, NP  08/06/2020, 10:12 AM

## 2020-08-08 NOTE — Progress Notes (Signed)
Addendum: Reviewed and agree with assessment and management plan. Okay for hemorrhoidal banding appointments if symptomatic hemorrhoids do not improve Chablis Losh, Lajuan Lines, MD

## 2020-08-09 ENCOUNTER — Other Ambulatory Visit: Payer: Self-pay

## 2020-08-09 ENCOUNTER — Ambulatory Visit (INDEPENDENT_AMBULATORY_CARE_PROVIDER_SITE_OTHER): Payer: Medicare Other

## 2020-08-09 DIAGNOSIS — E538 Deficiency of other specified B group vitamins: Secondary | ICD-10-CM | POA: Diagnosis not present

## 2020-08-09 MED ORDER — CYANOCOBALAMIN 1000 MCG/ML IJ SOLN
1000.0000 ug | Freq: Once | INTRAMUSCULAR | Status: AC
  Administered 2020-08-09: 1000 ug via INTRAMUSCULAR

## 2020-08-09 NOTE — Progress Notes (Signed)
Pt was given 3rd [id number] w/o any complications.

## 2020-08-18 ENCOUNTER — Ambulatory Visit
Admission: RE | Admit: 2020-08-18 | Discharge: 2020-08-18 | Disposition: A | Discharge status: Home / Self Care | Payer: Medicare Other | Source: Ambulatory Visit | Attending: Internal Medicine | Admitting: Internal Medicine

## 2020-08-18 ENCOUNTER — Other Ambulatory Visit: Payer: Self-pay

## 2020-08-18 DIAGNOSIS — Z1231 Encounter for screening mammogram for malignant neoplasm of breast: Secondary | ICD-10-CM | POA: Diagnosis not present

## 2020-08-23 ENCOUNTER — Ambulatory Visit (INDEPENDENT_AMBULATORY_CARE_PROVIDER_SITE_OTHER): Payer: Medicare Other

## 2020-08-23 ENCOUNTER — Other Ambulatory Visit: Payer: Self-pay | Admitting: Internal Medicine

## 2020-08-23 ENCOUNTER — Other Ambulatory Visit: Payer: Self-pay

## 2020-08-23 DIAGNOSIS — R0602 Shortness of breath: Secondary | ICD-10-CM

## 2020-08-23 DIAGNOSIS — E538 Deficiency of other specified B group vitamins: Secondary | ICD-10-CM | POA: Diagnosis not present

## 2020-08-23 DIAGNOSIS — R5383 Other fatigue: Secondary | ICD-10-CM

## 2020-08-23 DIAGNOSIS — R238 Other skin changes: Secondary | ICD-10-CM

## 2020-08-23 NOTE — Progress Notes (Signed)
Pt came into the office to receive her 4th bi-weekly b12 injection. Pt tolerated the injection well. Pt would like know when you check her vitamin b12 can you add a iron panel with ferritin, she stated that she hasn't seen any change since starting the b12 injections.

## 2020-08-24 ENCOUNTER — Encounter: Payer: Self-pay | Admitting: Internal Medicine

## 2020-09-07 DIAGNOSIS — H02055 Trichiasis without entropian left lower eyelid: Secondary | ICD-10-CM | POA: Diagnosis not present

## 2020-09-08 ENCOUNTER — Other Ambulatory Visit: Payer: Self-pay

## 2020-09-08 ENCOUNTER — Other Ambulatory Visit (INDEPENDENT_AMBULATORY_CARE_PROVIDER_SITE_OTHER): Payer: Medicare Other

## 2020-09-08 DIAGNOSIS — E538 Deficiency of other specified B group vitamins: Secondary | ICD-10-CM

## 2020-09-08 LAB — VITAMIN B12: Vitamin B-12: 315 pg/mL (ref 211–911)

## 2020-09-24 DIAGNOSIS — Z23 Encounter for immunization: Secondary | ICD-10-CM | POA: Diagnosis not present

## 2020-10-19 DIAGNOSIS — Z23 Encounter for immunization: Secondary | ICD-10-CM | POA: Diagnosis not present

## 2020-10-25 IMAGING — MG DIGITAL SCREENING BILAT W/ TOMO W/ CAD
6 of 12 series · 6 of 36 positions shown · non-contrast
Comparison: Previous exam(s).

CLINICAL DATA: Screening.

EXAM:
DIGITAL SCREENING BILATERAL MAMMOGRAM WITH TOMO AND CAD

[R MLO synth-2D (1 of 2)]
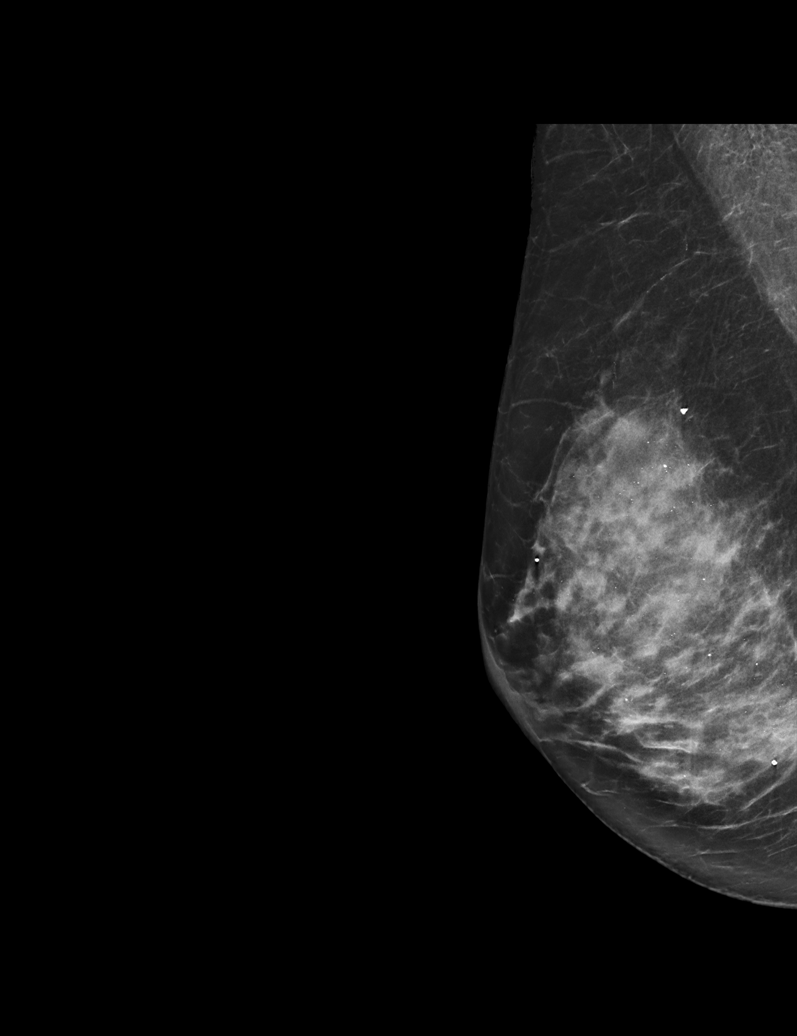

[R CC synth-2D]
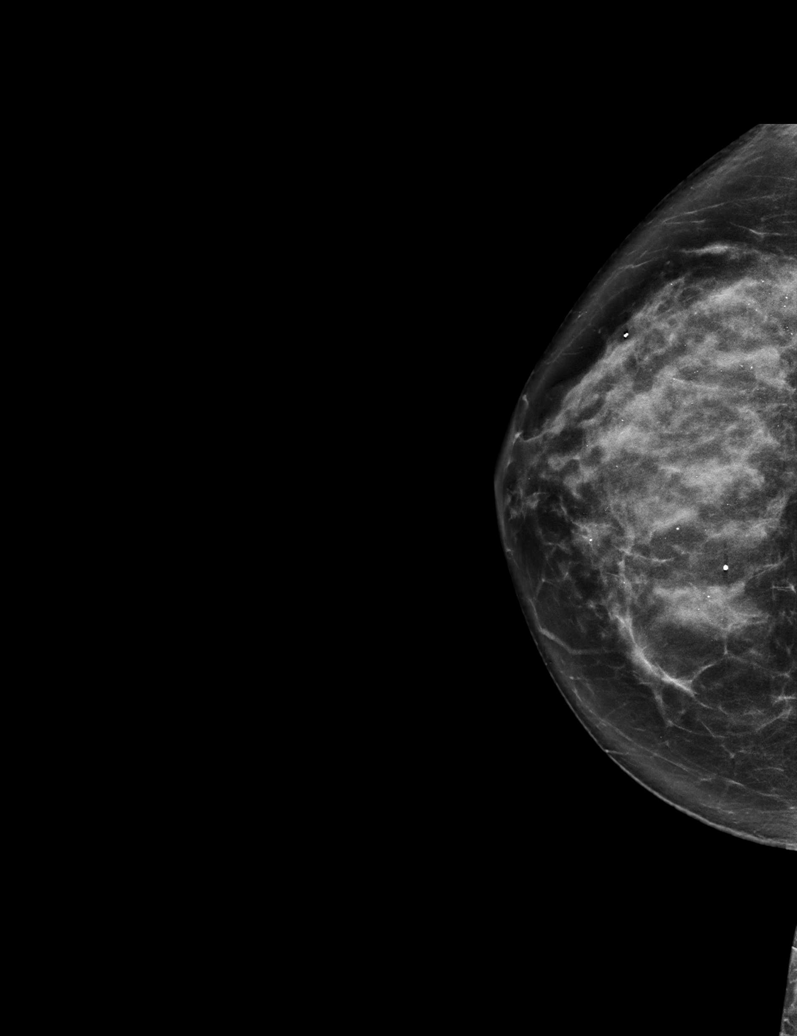

[R MLO synth-2D (2 of 2)]
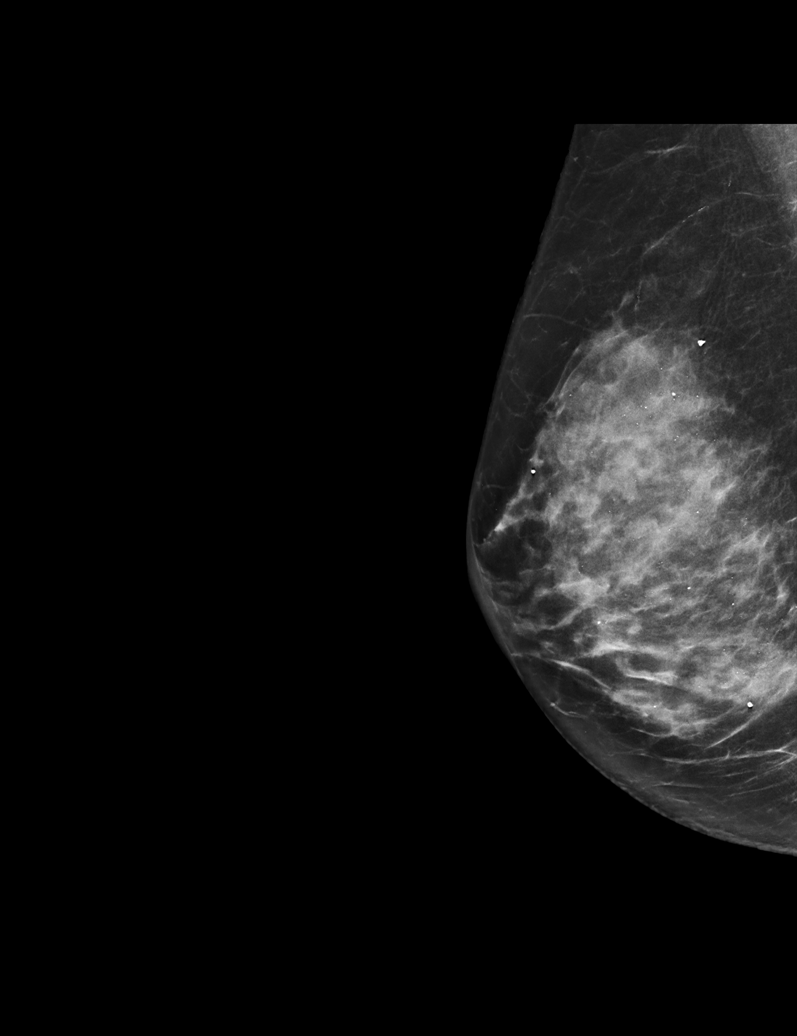

[L CC synth-2D]
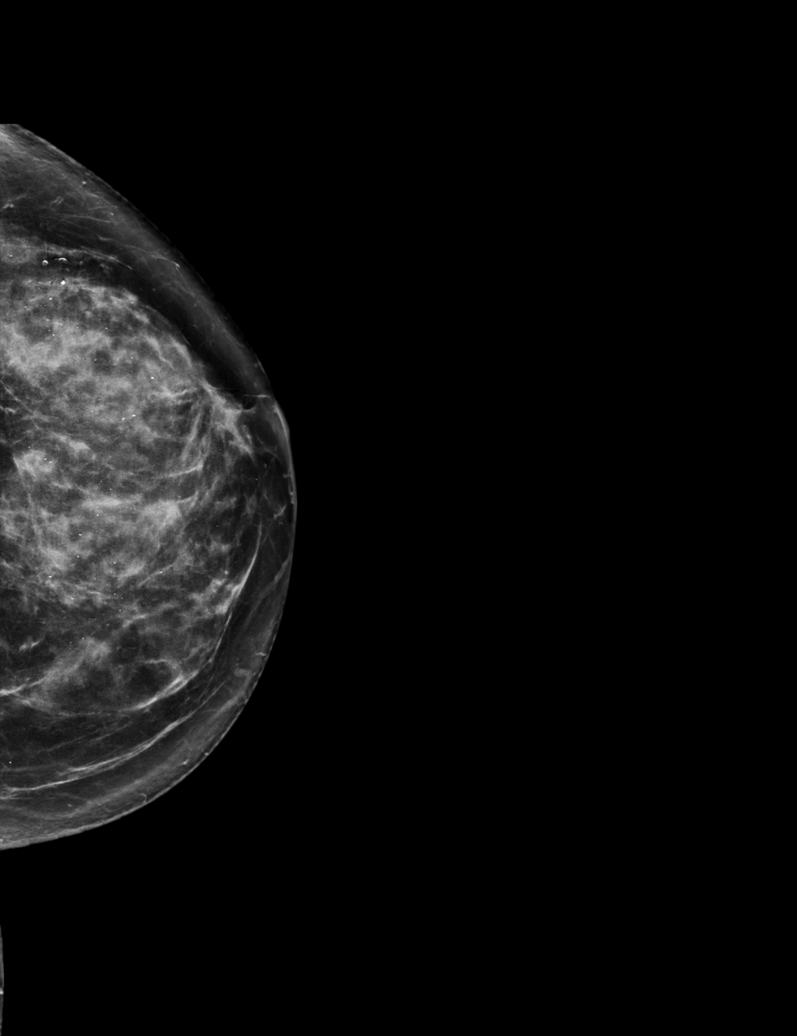

[L MLO synth-2D (1 of 2)]
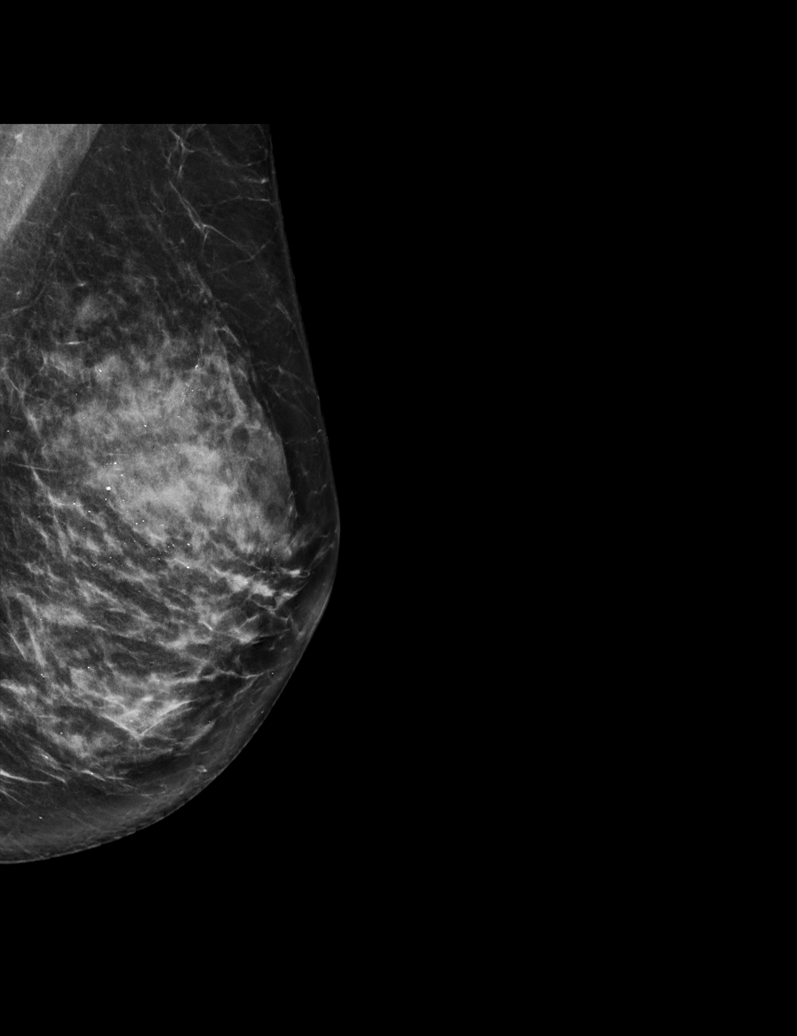

[L MLO synth-2D (2 of 2)]
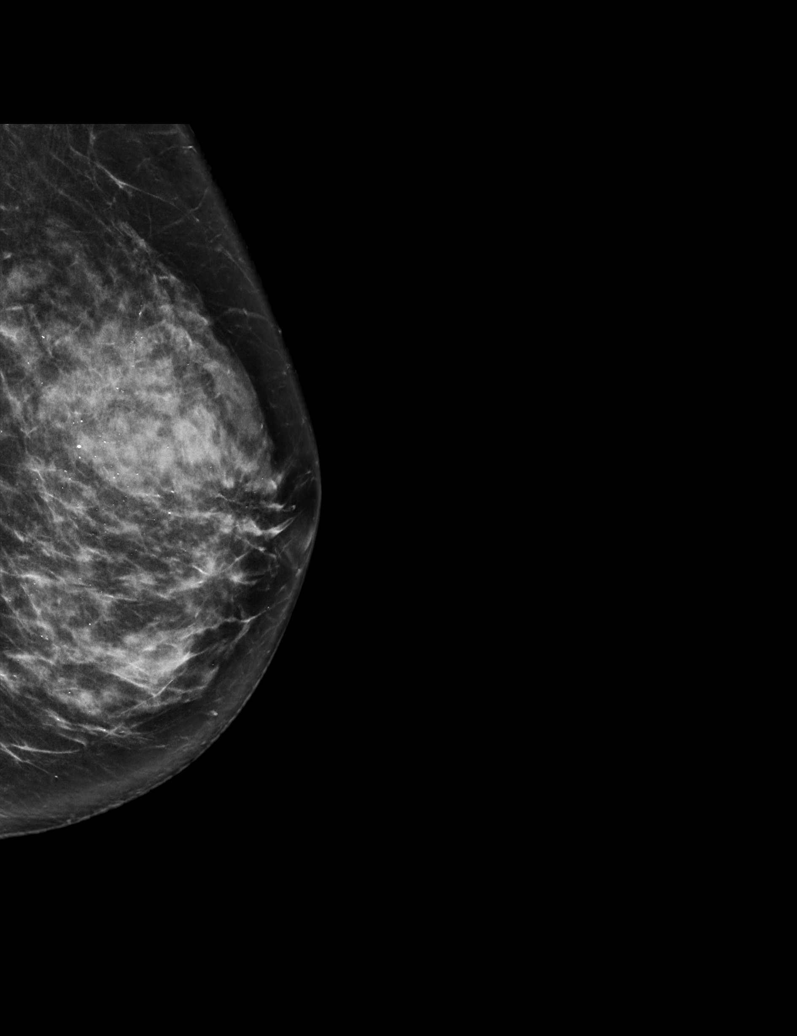

[6 of 36 positions shown; findings below may reference images not displayed]

ACR Breast Density Category d: The breast tissue is extremely dense,
which lowers the sensitivity of mammography
FINDINGS: There are no findings suspicious for malignancy. Images were
processed with CAD.
IMPRESSION: No mammographic evidence of malignancy. A result letter of this
screening mammogram will be mailed directly to the patient.

RECOMMENDATION:
Screening mammogram in one year. (Code:WO-0-ZI0)

BI-RADS CATEGORY  1: Negative.

## 2020-11-04 ENCOUNTER — Encounter: Payer: Self-pay | Admitting: Internal Medicine

## 2020-11-04 NOTE — Progress Notes (Signed)
Subjective:    Patient ID: Patricia Dillon, female    DOB: December 18, 1950, 70 y.o.   MRN: [id number]  This visit occurred during the SARS-CoV-2 public health emergency.  Safety protocols were in place, including screening questions prior to the visit, additional usage of staff PPE, and extensive cleaning of exam room while observing appropriate contact time as indicated for disinfecting solutions.    HPI She is here for an acute visit for cold symptoms.   Her symptoms started 10 days ago.  Her grandchildren had it first  She is experiencing nasal congestion and hacking cough that has transitioned into a rattling cough and she is bringing up some sputum typically in the morning.  Sputpum is pale yellow.  Occ wheeze.  No .  Her symptoms have improved.    She has not tried taking anything.  She really just wanted to make sure her lungs were clear.    Covid test x 2 neg.     Medications and allergies reviewed with patient and updated if appropriate.  Patient Active Problem List   Diagnosis Date Noted   Chest pressure 06/21/2020   B12 deficiency 06/21/2020   Change of skin color 05/07/2020   SOB (shortness of breath) 05/10/2018   Sinus arrhythmia 06/26/2016   Mitral valve regurgitation 06/26/2016   Routine general medical examination at a health care facility 06/24/2015   Thyroid nodule 73/22/0254   Lichen sclerosus 27/06/2374   Hyperlipidemia 12/30/2013   Hx of cyst of breast 12/30/2013    Current Outpatient Medications on File Prior to Visit  Medication Sig Dispense Refill   betamethasone dipropionate (DIPROLENE) 0.05 % ointment Apply topically.     cholecalciferol (VITAMIN D) 1000 units tablet Take 1,000 Units by mouth daily.     clobetasol cream (TEMOVATE) 2.83 % Apply 1 application topically as needed.     vitamin B-12 (CYANOCOBALAMIN) 500 MCG tablet Take 500 mcg by mouth daily.     Cyanocobalamin (VITAMIN B-12 IJ) Inject 1,000 mcg as directed every 14 (fourteen) days.  (Patient not taking: Reported on 11/05/2020)     No current facility-administered medications on file prior to visit.    Past Medical History:  Diagnosis Date   External hemorrhoids    Hyperlipidemia    Lichen sclerosus    Mitral valve regurgitation    Osteopenia    SVT (supraventricular tachycardia) (HCC)    Vitamin B12 deficiency     Past Surgical History:  Procedure Laterality Date   BREAST BIOPSY Left    benign   BREAST EXCISIONAL BIOPSY Left     Social History   Socioeconomic History   Marital status: Divorced    Spouse name: Not on file   Number of children: Not on file   Years of education: Not on file   Highest education level: Not on file  Occupational History   Not on file  Tobacco Use   Smoking status: Never   Smokeless tobacco: Never  Vaping Use   Vaping Use: Never used  Substance and Sexual Activity   Alcohol use: No    Alcohol/week: 0.0 standard drinks   Drug use: No   Sexual activity: Not on file  Other Topics Concern   Not on file  Social History Narrative   Not on file   Social Determinants of Health   Financial Resource Strain: Not on file  Food Insecurity: Not on file  Transportation Needs: Not on file  Physical Activity: Not on file  Stress: Not  on file  Social Connections: Not on file    Family History  Problem Relation Age of Onset   Hypertension Mother    Heart disease Father    Melanoma Daughter        age 72   Colon polyps Daughter    Colon polyps Son     Review of Systems  Constitutional:  Positive for fever (100.5 at one point).  HENT:  Positive for congestion. Negative for ear pain (ears clogged intermittently), sinus pressure, sinus pain and sore throat.   Respiratory:  Positive for cough, chest tightness and wheezing (occ). Negative for shortness of breath.   Gastrointestinal:  Negative for diarrhea and nausea.  Musculoskeletal:  Negative for myalgias.  Neurological:  Negative for headaches.      Objective:    Vitals:   11/05/20 0807  BP: (!) 146/82  Pulse: 73  Temp: 98 F (36.7 C)  SpO2: 98%   BP Readings from Last 3 Encounters:  11/05/20 (!) 146/82  08/06/20 (!) 150/90  06/21/20 132/78   Wt Readings from Last 3 Encounters:  11/05/20 116 lb 3.2 oz (52.7 kg)  08/06/20 116 lb 2 oz (52.7 kg)  06/21/20 115 lb 9.6 oz (52.4 kg)   Body mass index is 18.61 kg/m.   Physical Exam    GENERAL APPEARANCE: Appears stated age, well appearing, NAD EYES: conjunctiva clear, no icterus HENT: bilateral tympanic membranes and ear canals normal, oropharynx with no erythema or exudates, trachea midline, no cervical or supraclavicular lymphadenopathy LUNGS: Unlabored breathing, good air entry bilaterally, clear to auscultation without wheeze or crackles CARDIOVASCULAR: Normal S1,S2 , no edema SKIN: Warm, dry      Assessment & Plan:    See Problem List for Assessment and Plan of chronic medical problems.

## 2020-11-05 ENCOUNTER — Other Ambulatory Visit: Payer: Self-pay

## 2020-11-05 ENCOUNTER — Ambulatory Visit (INDEPENDENT_AMBULATORY_CARE_PROVIDER_SITE_OTHER): Payer: Medicare Other | Admitting: Internal Medicine

## 2020-11-05 DIAGNOSIS — J069 Acute upper respiratory infection, unspecified: Secondary | ICD-10-CM

## 2020-11-05 NOTE — Assessment & Plan Note (Signed)
Acute Symptoms likely viral in nature No need for treatment Continue symptomatic treatment with over-the-counter cold meds if needed Increase rest and fluids Call if symptoms worsen or do not improve

## 2020-11-08 DIAGNOSIS — K649 Unspecified hemorrhoids: Secondary | ICD-10-CM | POA: Diagnosis not present

## 2020-11-10 DIAGNOSIS — H524 Presbyopia: Secondary | ICD-10-CM | POA: Diagnosis not present

## 2020-11-10 DIAGNOSIS — H43813 Vitreous degeneration, bilateral: Secondary | ICD-10-CM | POA: Diagnosis not present

## 2020-11-10 DIAGNOSIS — H02055 Trichiasis without entropian left lower eyelid: Secondary | ICD-10-CM | POA: Diagnosis not present

## 2020-11-10 DIAGNOSIS — H2513 Age-related nuclear cataract, bilateral: Secondary | ICD-10-CM | POA: Diagnosis not present

## 2020-12-24 DIAGNOSIS — H02055 Trichiasis without entropian left lower eyelid: Secondary | ICD-10-CM | POA: Diagnosis not present

## 2020-12-24 DIAGNOSIS — H0100B Unspecified blepharitis left eye, upper and lower eyelids: Secondary | ICD-10-CM | POA: Diagnosis not present

## 2021-01-26 DIAGNOSIS — H02055 Trichiasis without entropian left lower eyelid: Secondary | ICD-10-CM | POA: Diagnosis not present

## 2021-02-03 DIAGNOSIS — Z681 Body mass index (BMI) 19 or less, adult: Secondary | ICD-10-CM | POA: Diagnosis not present

## 2021-02-03 DIAGNOSIS — L900 Lichen sclerosus et atrophicus: Secondary | ICD-10-CM | POA: Diagnosis not present

## 2021-02-03 DIAGNOSIS — Z01419 Encounter for gynecological examination (general) (routine) without abnormal findings: Secondary | ICD-10-CM | POA: Diagnosis not present

## 2021-02-16 ENCOUNTER — Encounter: Payer: Self-pay | Admitting: Internal Medicine

## 2021-02-16 DIAGNOSIS — E538 Deficiency of other specified B group vitamins: Secondary | ICD-10-CM

## 2021-02-17 ENCOUNTER — Other Ambulatory Visit (INDEPENDENT_AMBULATORY_CARE_PROVIDER_SITE_OTHER): Payer: Medicare Other

## 2021-02-17 ENCOUNTER — Other Ambulatory Visit: Payer: Self-pay

## 2021-02-17 DIAGNOSIS — E538 Deficiency of other specified B group vitamins: Secondary | ICD-10-CM | POA: Diagnosis not present

## 2021-02-17 LAB — VITAMIN B12: Vitamin B-12: 506 pg/mL (ref 211–911)

## 2021-03-30 DIAGNOSIS — H02055 Trichiasis without entropian left lower eyelid: Secondary | ICD-10-CM | POA: Diagnosis not present

## 2021-04-25 ENCOUNTER — Ambulatory Visit (INDEPENDENT_AMBULATORY_CARE_PROVIDER_SITE_OTHER): Payer: Medicare Other | Admitting: Internal Medicine

## 2021-04-25 ENCOUNTER — Encounter: Payer: Self-pay | Admitting: Internal Medicine

## 2021-04-25 DIAGNOSIS — J029 Acute pharyngitis, unspecified: Secondary | ICD-10-CM | POA: Insufficient documentation

## 2021-04-25 NOTE — Progress Notes (Signed)
? ?  Subjective:  ? ?Patient ID: Patricia Dillon, female    DOB: [birth year], 71 y.o.   MRN: [id number] ? ?Sore Throat  ?Associated symptoms include congestion. Pertinent negatives include no abdominal pain, coughing, diarrhea, shortness of breath or vomiting.  ?The patient is a 71 YO female coming in for sore throat intermittent about 2-3 weeks.  ? ?Review of Systems  ?Constitutional:  Positive for fatigue.  ?HENT:  Positive for congestion, postnasal drip and sore throat.   ?Eyes: Negative.   ?Respiratory:  Negative for cough, chest tightness and shortness of breath.   ?Cardiovascular:  Negative for chest pain, palpitations and leg swelling.  ?Gastrointestinal:  Negative for abdominal distention, abdominal pain, constipation, diarrhea, nausea and vomiting.  ?Musculoskeletal: Negative.   ?Skin: Negative.   ?Neurological: Negative.   ?Psychiatric/Behavioral: Negative.    ? ?Objective:  ?Physical Exam ?Constitutional:   ?   Appearance: She is well-developed.  ?HENT:  ?   Head: Normocephalic and atraumatic.  ?   Comments: Oropharynx with redness and clear drainage, nose with swollen turbinates, TMs normal bilaterally.  ?Neck:  ?   Thyroid: No thyromegaly.  ?Cardiovascular:  ?   Rate and Rhythm: Normal rate and regular rhythm.  ?Pulmonary:  ?   Effort: Pulmonary effort is normal. No respiratory distress.  ?   Breath sounds: Normal breath sounds. No wheezing or rales.  ?Abdominal:  ?   General: Bowel sounds are normal. There is no distension.  ?   Palpations: Abdomen is soft.  ?   Tenderness: There is no abdominal tenderness. There is no rebound.  ?Musculoskeletal:     ?   General: Tenderness present.  ?   Cervical back: Normal range of motion.  ?Lymphadenopathy:  ?   Cervical: No cervical adenopathy.  ?Skin: ?   General: Skin is warm and dry.  ?Neurological:  ?   Mental Status: She is alert and oriented to person, place, and time.  ?   Coordination: Coordination normal.  ? ? ?Vitals:  ? 04/25/21 0930  ?BP: 112/78  ?Pulse:  71  ?Resp: 18  ?Temp: 98 ?F (36.7 ?C)  ?TempSrc: Oral  ?SpO2: 97%  ?Weight: 120 lb (54.4 kg)  ?Height: 5' 6.25" (1.683 m)  ? ?Visit time 20 minutes in face to face communication with patient and coordination of care, additional 2 minutes spent in record review, coordination or care, ordering tests, communicating/referring to other healthcare professionals, documenting in medical records all on the same day of the visit for total time 22 minutes spent on the visit.  ? ? ?This visit occurred during the SARS-CoV-2 public health emergency.  Safety protocols were in place, including screening questions prior to the visit, additional usage of staff PPE, and extensive cleaning of exam room while observing appropriate contact time as indicated for disinfecting solutions.  ? ?Assessment & Plan:  ? ?

## 2021-04-25 NOTE — Assessment & Plan Note (Signed)
Likely post viral nasal drainage. Recommended claritin or zyrtec or allegra otc and she will try.  ?

## 2021-04-29 ENCOUNTER — Ambulatory Visit: Payer: Medicare Other | Admitting: Internal Medicine

## 2021-05-03 ENCOUNTER — Ambulatory Visit (INDEPENDENT_AMBULATORY_CARE_PROVIDER_SITE_OTHER): Payer: Medicare Other | Admitting: Internal Medicine

## 2021-05-03 ENCOUNTER — Encounter: Payer: Self-pay | Admitting: Internal Medicine

## 2021-05-03 VITALS — BP 120/78 | HR 64 | Ht 66.25 in | Wt 119.4 lb

## 2021-05-03 DIAGNOSIS — E041 Nontoxic single thyroid nodule: Secondary | ICD-10-CM

## 2021-05-03 NOTE — Patient Instructions (Addendum)
We will order a new thyroid U/S. ? ?Please return 1 year. ?

## 2021-05-03 NOTE — Progress Notes (Signed)
Patient ID: Patricia Dillon, female   DOB: Nov 07, 1950, 71 y.o.   MRN: [id number] ? ?This visit occurred during the SARS-CoV-2 public health emergency.  Safety protocols were in place, including screening questions prior to the visit, additional usage of staff PPE, and extensive cleaning of exam room while observing appropriate contact time as indicated for disinfecting solutions.  ? ?HPI  ?Patricia Dillon is a 71 y.o.-year-old female, initially referred by her PCP, Dr. Doug Sou, for management of thyroid nodule. She moved from [place] in 10/2013. Last visit 1 year ago. ? ?Interim history: ?She has a h/o sore throat due to post nasal drip related to deviated septum and allergies.  Also had enlarged lymph nodes in neck (this is recurrent for her).  ?Since last visit, she started Silver sneakers twice a week and she also does strength exercises once a week. ? ?Reviewed and addended history: ?Pt. has a history of a subcentimeter right thyroid nodule that was incidentally found on MRI of the neck in 2010  - after a fall.  It was followed by serial ultrasounds: ? ?Reviewed the reports and images of her thyroid ultrasounds: ?09/30/2008: 0.7 x 0.4 x 0.4 cm ?05/07/2009: 0.56 x 0.36 x 0.47 cm ?07/12/2010: 0.63 x 0.37 x 0.39 cm ?10/27/2012: 0.72 x 0.55 x 0.71 cm - 2 hyperechoic areas that appear as microcalcifications. Thyroid gland was homogeneous on U/S. ? ? ? ?05/10/2015: Thyroid ultrasound: ?Right thyroid lobe: 40 x 13 x 14 mm.  ?- 9 x 7 x 8 mm solid nodule with coarse calcification, inferior pole (previously 7 x 6 x 7).  ?- 3 mm hypoechoic nodule, superior pole. ?Left thyroid lobe: 42 x 9 x 11 mm.  ?- Two small colloid nodules less than 4 mm. ?Isthmus Thickness: 1.4 mm.  No nodules visualized. ?Lymphadenopathy: None visualized. ?  ?IMPRESSION: ?1. Small bilateral nodules as above. Findings do not meet current consensus criteria for biopsy. Follow-up by clinical exam is recommended. ? ? ? ?Reviewing the images, nodule appears  isoechoic, and the "coarse calcification" appears to be a colloid granules with comet tail sign (benign finding).  Since the nodule appeared to be low risk for cancer, I did not suggest biopsy. ? ?04/09/2019: Thyroid ultrasound: ?Nodule # 1: ?Prior biopsy: No ?Location: Right; Inferior ?Maximum size: 1.2 cm; Other 2 dimensions: 0.9 x 0.9 cm, previously, 0.9 x 0.7 x 0.8 cm ?Composition: mixed cystic and solid (1) ?Echogenicity: isoechoic (1) ?Echogenic foci: punctate echogenic foci (3) ? *Given size (>/= 1 - 1.4 cm) and appearance, a follow-up ultrasound ?in 1 year should be considered based on TI-RADS criteria. ?  ?IMPRESSION: ?1. Normal-sized thyroid with solitary right nodule. ?Recommend annual/biennial ultrasound follow-up as above, until ?stability x5 years confirmed. ? ? ? ?06/07/2020: Thyroid ultrasound: ?Nodule # 1:  ?Prior biopsy: No  ?Location: Right; inferior  ?Maximum size: 1.3 cm; Other 2 dimensions: 0.9 x 0.8 cm, previously, 1.2 x 0.9 x 0.9 cm  ?Composition: solid/almost completely solid (2)  ?Echogenicity: isoechoic (1) ?Echogenic foci: punctate echogenic foci (3) ?ACR TI-RADS total points: 6. ?*Given size (>/= 1 - 1.4 cm) and appearance, a follow-up ultrasound ?in 1 year should be considered based on TI-RADS criteria. ?_________________________________________________________ ?  ?IMPRESSION: ?Solitary TI-RADS 4 right thyroid nodule is not significantly changed ?in size since prior exam. ?  ?Continued annual follow-up recommended until 5 year stability is confirmed. ? ?Reviewed patient's TFTs: ?Lab Results  ?Component Value Date  ? TSH 2.17 05/07/2020  ? TSH 2.12 04/30/2019  ?  TSH 2.900 07/11/2016  ? TSH 1.18 06/24/2015  ? TSH 1.300 04/09/2014  ? FREET4 0.63 05/07/2020  ? FREET4 1.13 07/11/2016  ?02/22/2017 (Dr. Marylynn Pearson): TSH 2.90 ?08/16/2012: TSH 2.480, fT4 1.23  ? ?Pt denies: ?- feeling nodules in neck ?- hoarseness ?- dysphagia ?- choking ?- SOB with lying down ? ?Pt does not have a FH of thyroid  ds. No FH of thyroid cancer. No h/o radiation tx to head or neck. ?No recent contrast studies. No herbal supplements. No Biotin use. On B12. No recent steroids use.  ? ?She also has a history of Mitral regurgitation.  She also has a history of osteopenia.  She is on vitamin D. ?She has lichen sclerosis >> she has clobetasol cream, but only uses it as needed. ? ?ROS: ?+ see HPI ? ?I reviewed pt's medications, allergies, PMH, social hx, family hx, and changes were documented in the history of present illness. Otherwise, unchanged from my initial visit note. ? ?Past Medical History:  ?Diagnosis Date  ? External hemorrhoids   ? Hyperlipidemia   ? Lichen sclerosus   ? Mitral valve regurgitation   ? Osteopenia   ? SVT (supraventricular tachycardia) ([place])   ? Vitamin B12 deficiency   ? ?Past Surgical History:  ?Procedure Laterality Date  ? BREAST BIOPSY Left   ? benign  ? BREAST EXCISIONAL BIOPSY Left   ? ?History  ? ?Social History  ? Marital Status: Divorced  ?  Spouse Name: N/A  ? Number of Children: 2  ? ?Occupational History  ? Scientist, water quality.  ? ?Social History Main Topics  ? Smoking status: Never Smoker   ? Smokeless tobacco: Not on file  ? Alcohol Use: No  ? Drug Use: No  ? ?Current Outpatient Medications on File Prior to Visit  ?Medication Sig Dispense Refill  ? betamethasone dipropionate (DIPROLENE) 0.05 % ointment Apply topically.    ? cholecalciferol (VITAMIN D) 1000 units tablet Take 2,000 Units by mouth daily.    ? clobetasol cream (TEMOVATE) 7.59 % Apply 1 application topically as needed.    ? vitamin B-12 (CYANOCOBALAMIN) 500 MCG tablet Take 500 mcg by mouth daily.    ? ?No current facility-administered medications on file prior to visit.  ? ?Allergies  ?Allergen Reactions  ? Vancomycin Itching  ? Penicillins Rash  ? ?Family History  ?Problem Relation Age of Onset  ? Hypertension Mother   ? Heart disease Father   ? Melanoma Daughter   ?     age 76  ? Colon polyps Daughter   ? Colon polyps Son   ? ?PE: ?BP 120/78  (BP Location: Left Arm, Patient Position: Sitting, Cuff Size: Normal)   Pulse 64   Ht 5' 6.25" (1.683 m)   Wt 119 lb 6.4 oz (54.2 kg)   SpO2 99%   BMI 19.13 kg/m?   ?Wt Readings from Last 3 Encounters:  ?05/03/21 119 lb 6.4 oz (54.2 kg)  ?04/25/21 120 lb (54.4 kg)  ?11/05/20 116 lb 3.2 oz (52.7 kg)  ? ?Constitutional: normal weight, in NAD ?Eyes: PERRLA, EOMI, no exophthalmos ?ENT: moist mucous membranes, no thyromegaly and no nodules palpated, no cervical lymphadenopathy ?Cardiovascular: RRR, No MRG ?Respiratory: CTA B ?Musculoskeletal: no deformities, strength intact in all 4 ?Skin: moist, warm, no rashes ?Neurological: no tremor with outstretched hands, DTR normal in all 4 ? ?ASSESSMENT: ?1. R Thyroid nodule ? ?PLAN: ?1.  Thyroid nodule ?-I reviewed the images and the report of her 2014, 2017, and 2021 thyroid ultrasounds.  The right thyroid nodule was very small on the initial ultrasound, without internal blood flow, more wide than tall, and without invaginations in the surrounding tissue.  The nodule was isoechoic/mildly hypoechoic and had 2 colloid granule (rather than calcifications as initially characterized on the report).  This is a benign finding, due to inspissated colloid.  The nodule was stable in size over 8 years, from 2010-2017.  On the last ultrasound from 2021, the nodule appears to have a larger colloid component, and this was probably the reason why it appeared larger.  The recommendation was to repeat the ultrasound in a year. ?-At last visit, we discussed about repeating the ultrasound and if the nodule was stable, to not investigate further.  Indeed, she had another thyroid ultrasound on 06/07/2020 and this showed that the nodule was stable, measuring 1.3 x 0.9 x 0.8 cm, isoechoic, and without worrisome characteristics.  However, due to the fact that it contained punctate hyperechoic foci, recommendation was made to continue to follow it until 5 years of stability was noted.   ?-She does  not have a family history of thyroid cancer or personal history of radiation therapy to head or neck to increase her risk of thyroid cancer. ?-She does not have neck compression symptoms, only has sore throa

## 2021-05-10 ENCOUNTER — Other Ambulatory Visit: Payer: Self-pay | Admitting: Internal Medicine

## 2021-05-10 DIAGNOSIS — Z1231 Encounter for screening mammogram for malignant neoplasm of breast: Secondary | ICD-10-CM

## 2021-05-10 DIAGNOSIS — H02055 Trichiasis without entropian left lower eyelid: Secondary | ICD-10-CM | POA: Diagnosis not present

## 2021-05-13 ENCOUNTER — Ambulatory Visit: Payer: Medicare Other

## 2021-05-13 ENCOUNTER — Telehealth: Payer: Self-pay

## 2021-05-13 NOTE — Telephone Encounter (Signed)
Please call pt to schedule or reschedule AWV. Thanks  ? ?Pt calling after not receiving her AWV appt call at [phone]. Please reschedule ?

## 2021-05-17 ENCOUNTER — Ambulatory Visit (INDEPENDENT_AMBULATORY_CARE_PROVIDER_SITE_OTHER): Payer: Medicare Other | Admitting: *Deleted

## 2021-05-17 DIAGNOSIS — Z0000 Encounter for general adult medical examination without abnormal findings: Secondary | ICD-10-CM | POA: Diagnosis not present

## 2021-05-17 NOTE — Patient Instructions (Signed)
Patricia Dillon , ?Thank you for taking time to come for your Medicare Wellness Visit. I appreciate your ongoing commitment to your health goals. Please review the following plan we discussed and let me know if I can assist you in the future.  ? ?Screening recommendations/referrals: ?Colonoscopy: up to date ?Mammogram: up to date (scheduled) ?Bone Density: up to date (scheduled ?Recommended yearly ophthalmology/optometry visit for glaucoma screening and checkup ?Recommended yearly dental visit for hygiene and checkup ? ?Vaccinations: ?Influenza vaccine: up to date ?Pneumococcal vaccine: Education provided ?Tdap vaccine: Education provided ?Shingles vaccine: Education provided   ? ?Advanced directives: Education provided ? ?Conditions/risks identified:  ? ?Next appointment: 06-13-2021 @ 9:40  [place] ? ? ?Preventive Care 70 Years and Older, Female ?Preventive care refers to lifestyle choices and visits with your health care provider that can promote health and wellness. ?What does preventive care include? ?A yearly physical exam. This is also called an annual well check. ?Dental exams once or twice a year. ?Routine eye exams. Ask your health care provider how often you should have your eyes checked. ?Personal lifestyle choices, including: ?Daily care of your teeth and gums. ?Regular physical activity. ?Eating a healthy diet. ?Avoiding tobacco and drug use. ?Limiting alcohol use. ?Practicing safe sex. ?Taking low-dose aspirin every day. ?Taking vitamin and mineral supplements as recommended by your health care provider. ?What happens during an annual well check? ?The services and screenings done by your health care provider during your annual well check will depend on your age, overall health, lifestyle risk factors, and family history of disease. ?Counseling  ?Your health care provider may ask you questions about your: ?Alcohol use. ?Tobacco use. ?Drug use. ?Emotional well-being. ?Home and relationship well-being. ?Sexual  activity. ?Eating habits. ?History of falls. ?Memory and ability to understand (cognition). ?Work and work Statistician. ?Reproductive health. ?Screening  ?You may have the following tests or measurements: ?Height, weight, and BMI. ?Blood pressure. ?Lipid and cholesterol levels. These may be checked every 5 years, or more frequently if you are over 31 years old. ?Skin check. ?Lung cancer screening. You may have this screening every year starting at age 78 if you have a 30-pack-year history of smoking and currently smoke or have quit within the past 15 years. ?Fecal occult blood test (FOBT) of the stool. You may have this test every year starting at age 80. ?Flexible sigmoidoscopy or colonoscopy. You may have a sigmoidoscopy every 5 years or a colonoscopy every 10 years starting at age 66. ?Hepatitis C blood test. ?Hepatitis B blood test. ?Sexually transmitted disease (STD) testing. ?Diabetes screening. This is done by checking your blood sugar (glucose) after you have not eaten for a while (fasting). You may have this done every 1-3 years. ?Bone density scan. This is done to screen for osteoporosis. You may have this done starting at age 67. ?Mammogram. This may be done every 1-2 years. Talk to your health care provider about how often you should have regular mammograms. ?Talk with your health care provider about your test results, treatment options, and if necessary, the need for more tests. ?Vaccines  ?Your health care provider may recommend certain vaccines, such as: ?Influenza vaccine. This is recommended every year. ?Tetanus, diphtheria, and acellular pertussis (Tdap, Td) vaccine. You may need a Td booster every 10 years. ?Zoster vaccine. You may need this after age 51. ?Pneumococcal 13-valent conjugate (PCV13) vaccine. One dose is recommended after age 75. ?Pneumococcal polysaccharide (PPSV23) vaccine. One dose is recommended after age 71. ?Talk to your health care  provider about which screenings and vaccines  you need and how often you need them. ?This information is not intended to replace advice given to you by your health care provider. Make sure you discuss any questions you have with your health care provider. ?Document Released: 01/15/2015 Document Revised: 09/08/2015 Document Reviewed: 10/20/2014 ?Elsevier Interactive Patient Education ? 2017 [place]. ? ?Fall Prevention in the Home ?Falls can cause injuries. They can happen to people of all ages. There are many things you can do to make your home safe and to help prevent falls. ?What can I do on the outside of my home? ?Regularly fix the edges of walkways and driveways and fix any cracks. ?Remove anything that might make you trip as you walk through a door, such as a raised step or threshold. ?Trim any bushes or trees on the path to your home. ?Use bright outdoor lighting. ?Clear any walking paths of anything that might make someone trip, such as rocks or tools. ?Regularly check to see if handrails are loose or broken. Make sure that both sides of any steps have handrails. ?Any raised decks and porches should have guardrails on the edges. ?Have any leaves, snow, or ice cleared regularly. ?Use sand or salt on walking paths during winter. ?Clean up any spills in your garage right away. This includes oil or grease spills. ?What can I do in the bathroom? ?Use night lights. ?Install grab bars by the toilet and in the tub and shower. Do not use towel bars as grab bars. ?Use non-skid mats or decals in the tub or shower. ?If you need to sit down in the shower, use a plastic, non-slip stool. ?Keep the floor dry. Clean up any water that spills on the floor as soon as it happens. ?Remove soap buildup in the tub or shower regularly. ?Attach bath mats securely with double-sided non-slip rug tape. ?Do not have throw rugs and other things on the floor that can make you trip. ?What can I do in the bedroom? ?Use night lights. ?Make sure that you have a light by your bed that  is easy to reach. ?Do not use any sheets or blankets that are too big for your bed. They should not hang down onto the floor. ?Have a firm chair that has side arms. You can use this for support while you get dressed. ?Do not have throw rugs and other things on the floor that can make you trip. ?What can I do in the kitchen? ?Clean up any spills right away. ?Avoid walking on wet floors. ?Keep items that you use a lot in easy-to-reach places. ?If you need to reach something above you, use a strong step stool that has a grab bar. ?Keep electrical cords out of the way. ?Do not use floor polish or wax that makes floors slippery. If you must use wax, use non-skid floor wax. ?Do not have throw rugs and other things on the floor that can make you trip. ?What can I do with my stairs? ?Do not leave any items on the stairs. ?Make sure that there are handrails on both sides of the stairs and use them. Fix handrails that are broken or loose. Make sure that handrails are as long as the stairways. ?Check any carpeting to make sure that it is firmly attached to the stairs. Fix any carpet that is loose or worn. ?Avoid having throw rugs at the top or bottom of the stairs. If you do have throw rugs, attach them to the  floor with carpet tape. ?Make sure that you have a light switch at the top of the stairs and the bottom of the stairs. If you do not have them, ask someone to add them for you. ?What else can I do to help prevent falls? ?Wear shoes that: ?Do not have high heels. ?Have rubber bottoms. ?Are comfortable and fit you well. ?Are closed at the toe. Do not wear sandals. ?If you use a stepladder: ?Make sure that it is fully opened. Do not climb a closed stepladder. ?Make sure that both sides of the stepladder are locked into place. ?Ask someone to hold it for you, if possible. ?Clearly mark and make sure that you can see: ?Any grab bars or handrails. ?First and last steps. ?Where the edge of each step is. ?Use tools that help you  move around (mobility aids) if they are needed. These include: ?Canes. ?Walkers. ?Scooters. ?Crutches. ?Turn on the lights when you go into a dark area. Replace any light bulbs as soon as they burn o

## 2021-05-17 NOTE — Progress Notes (Signed)
? ?Subjective:  ? Patricia Dillon is a 71 y.o. female who presents for Medicare Annual (Subsequent) preventive examination. ? ?I connected with  Patricia Dillon on 05/17/21 by a telephone enabled telemedicine application and verified that I am speaking with the correct person using two identifiers. ?  ?I discussed the limitations of evaluation and management by telemedicine. The patient expressed understanding and agreed to proceed. ? ?Patient location: home ? ?Provider location: Tele-health-home ? ? ? ?Review of Systems    ? ?Cardiac Risk Factors include: advanced age (>19mn, >>54women) ? ?   ?Objective:  ?  ?Today's Vitals  ? ?There is no height or weight on file to calculate BMI. ? ? ?  05/17/2021  ?  9:41 AM 03/22/2018  ?  9:01 PM  ?Advanced Directives  ?Does Patient Have a Medical Advance Directive? Yes No  ?Type of AScientist, forensicPower of Dillon   ?Copy of Patricia Dillon? No - copy requested   ?Would patient like information on creating a medical advance directive?  No - Patient declined  ? ? ?Current Medications (verified) ?Outpatient Encounter Medications as of 05/17/2021  ?Medication Sig  ? betamethasone dipropionate (DIPROLENE) 0.05 % ointment Apply topically.  ? cholecalciferol (VITAMIN D) 1000 units tablet Take 2,000 Units by mouth daily.  ? clobetasol cream (TEMOVATE) 09.89% Apply 1 application topically as needed.  ? vitamin B-12 (CYANOCOBALAMIN) 500 MCG tablet Take 500 mcg by mouth daily.  ? ?No facility-administered encounter medications on file as of 05/17/2021.  ? ? ?Allergies (verified) ?Vancomycin and Penicillins  ? ?History: ?Past Medical History:  ?Diagnosis Date  ? External hemorrhoids   ? Hyperlipidemia   ? Lichen sclerosus   ? Mitral valve regurgitation   ? Osteopenia   ? [place]   ? Vitamin B12 deficiency   ? ?Past Surgical History:  ?Procedure Laterality Date  ? BREAST BIOPSY Left   ? benign  ? BREAST EXCISIONAL BIOPSY  Left   ? ?Family History  ?Problem Relation Age of Onset  ? Hypertension Mother   ? Heart disease Father   ? Melanoma Daughter   ?     age 70 ? Colon polyps Daughter   ? Colon polyps Son   ? ?Social History  ? ?Socioeconomic History  ? Marital status: Divorced  ?  Spouse name: Not on file  ? Number of children: Not on file  ? Years of education: Not on file  ? Highest education level: Not on file  ?Occupational History  ? Not on file  ?Tobacco Use  ? Smoking status: Never  ? Smokeless tobacco: Never  ?Vaping Use  ? Vaping Use: Never used  ?Substance and Sexual Activity  ? Alcohol use: No  ?  Alcohol/week: 0.0 standard drinks  ? Drug use: No  ? Sexual activity: Not on file  ?Other Topics Concern  ? Not on file  ?Social History Narrative  ? Not on file  ? ?Social Determinants of Health  ? ?Financial Resource Strain: Low Risk   ? Difficulty of Paying Living Expenses: Not hard at all  ?Food Insecurity: No Food Insecurity  ? Worried About RCharity fundraiserin the Last Year: Never true  ? Ran Out of Food in the Last Year: Never true  ?Transportation Needs: No Transportation Needs  ? Lack of Transportation (Medical): No  ? Lack of Transportation (Non-Medical): No  ?Physical Activity: Sufficiently Active  ? Days of Exercise  per Week: 4 days  ? Minutes of Exercise per Session: 60 min  ?Stress: No Stress Concern Present  ? Feeling of Stress : Not at all  ?Social Connections: Moderately Isolated  ? Frequency of Communication with Friends and Family: More than three times a week  ? Frequency of Social Gatherings with Friends and Family: More than three times a week  ? Attends Religious Services: Never  ? Active Member of Clubs or Organizations: No  ? Attends Archivist Meetings: More than 4 times per year  ? Marital Status: Divorced  ? ? ?Tobacco Counseling ?Counseling given: Not Answered ? ? ?Clinical Intake: ? ?Pre-visit preparation completed: Yes ? ?Pain : No/denies pain ? ?  ? ?Nutritional Risks:  None ?Diabetes: No ? ?How often do you need to have someone help you when you read instructions, pamphlets, or other written materials from your doctor or pharmacy?: 1 - Never ? ?Diabetic?  no ? ?Interpreter Needed?: No ? ?Information entered by :: Patricia Kennedy LPN ? ? ?Activities of Daily Living ? ?  05/17/2021  ?  9:45 AM  ?In your present state of health, do you have any difficulty performing the following activities:  ?Hearing? 0  ?Vision? 0  ?Difficulty concentrating or making decisions? 0  ?Walking or climbing stairs? 0  ?Dressing or bathing? 0  ?Doing errands, shopping? 0  ?Preparing Food and eating ? N  ?Using the Toilet? N  ?In the past six months, have you accidently leaked urine? N  ?Do you have problems with loss of bowel control? N  ?Managing your Medications? N  ?Managing your Finances? N  ?Housekeeping or managing your Housekeeping? N  ? ? ?Patient Care Team: ?Hoyt Koch, MD as PCP - General (Internal Medicine) ? ?Indicate any recent Medical Services you may have received from other than [facility] providers in the past year (date may be approximate). ? ?   ?Assessment:  ? This is a routine wellness examination for Patricia Dillon. ? ?Hearing/Vision screen ?Hearing Screening - Comments:: Up to date ?Vision Screening - Comments:: Up to date ?Dr. Satira Sark ?[facility] ? ?Dietary issues and exercise activities discussed: ?Current Exercise Habits: Structured exercise class, Time (Minutes): 50, Frequency (Times/Week): 4, Weekly Exercise (Minutes/Week): 200, Intensity: Moderate, Exercise limited by: None identified ? ? Goals Addressed   ? ?  ?  ?  ?  ? This Visit's Progress  ?  Patient Stated     ?  Remain consistent with exercise program ?Like to gain weight. ?  ? ?  ? ?Depression Screen ? ?  05/17/2021  ?  9:55 AM 04/25/2021  ?  9:34 AM 03/03/2020  ?  9:51 AM 07/12/2017  ?  4:12 PM 06/26/2016  ?  1:14 PM  ?PHQ 2/9 Scores  ?PHQ - 2 Score 0 0 0 0 0  ?  ?Fall Risk ? ?  05/17/2021  ?  9:42 AM 04/25/2021  ?  9:34 AM  03/03/2020  ?  9:51 AM 11/27/2018  ?  9:51 AM 07/12/2017  ?  4:12 PM  ?Fall Risk   ?Falls in the past year? 0 0 0 0 No  ?Comment    Emmi Telephone Survey: data to providers prior to load   ?Number falls in past yr: 0 0 0    ?Injury with Fall? 0 0 0    ?Follow up Falls evaluation completed;Education provided;Falls prevention discussed      ? ? ?FALL RISK PREVENTION PERTAINING TO THE HOME: ? ?Any stairs  in or around the home? No  ?If so, are there any without handrails? No  ?Home free of loose throw rugs in walkways, pet beds, electrical cords, etc? Yes  ?Adequate lighting in your home to reduce risk of falls? Yes  ? ?ASSISTIVE DEVICES UTILIZED TO PREVENT FALLS: ? ?Life alert? No  ?Use of a cane, walker or w/c? No  ?Grab bars in the bathroom? No  ?Shower chair or bench in shower? No  ?Elevated toilet seat or a handicapped toilet? No  ? ?TIMED UP AND GO: ? ?Was the test performed? No .  ? ? ?Cognitive Function: ?  ?  ? ?  05/17/2021  ?  9:43 AM  ?6CIT Screen  ?What Year? 0 points  ?What month? 0 points  ?What time? 0 points  ?Count back from 20 0 points  ?Months in reverse 0 points  ?Repeat phrase 0 points  ?Total Score 0 points  ? ? ?Immunizations ?Immunization History  ?Administered Date(s) Administered  ? Influenza Split 10/23/2019  ? Influenza,inj,Quad PF,6+ Mos 11/30/2014, 11/09/2016, 10/12/2017, 09/05/2018  ? Influenza-Unspecified 10/19/2020  ? PFIZER(Purple Top)SARS-COV-2 Vaccination 01/21/2019, 02/11/2019, 10/08/2019  ? Tdap 01/03/2000, 11/05/2014  ? Unspecified SARS-COV-2 Vaccination 01/21/2019, 02/11/2019, 10/08/2019, 09/24/2020  ? ? ?TDAP status: Up to date ? ?Flu Vaccine status: Up to date ? ?Pneumococcal vaccine status: Due, Education has been provided regarding the importance of this vaccine. Advised may receive this vaccine at local pharmacy or Health Dept. Aware to provide a copy of the vaccination record if obtained from local pharmacy or Health Dept. Verbalized acceptance and understanding. ? ?Covid-19  vaccine status: Information provided on how to obtain vaccines.  ? ?Qualifies for Shingles Vaccine? Yes   ?Zostavax completed No   ?Shingrix Completed?: No.    Education has been provided regarding the importance of th

## 2021-06-06 DIAGNOSIS — R29890 Loss of height: Secondary | ICD-10-CM | POA: Diagnosis not present

## 2021-06-06 DIAGNOSIS — N958 Other specified menopausal and perimenopausal disorders: Secondary | ICD-10-CM | POA: Diagnosis not present

## 2021-06-06 DIAGNOSIS — M8588 Other specified disorders of bone density and structure, other site: Secondary | ICD-10-CM | POA: Diagnosis not present

## 2021-06-09 ENCOUNTER — Ambulatory Visit (INDEPENDENT_AMBULATORY_CARE_PROVIDER_SITE_OTHER): Payer: Medicare Other | Admitting: Podiatry

## 2021-06-09 ENCOUNTER — Ambulatory Visit (INDEPENDENT_AMBULATORY_CARE_PROVIDER_SITE_OTHER): Payer: Medicare Other

## 2021-06-09 DIAGNOSIS — M216X2 Other acquired deformities of left foot: Secondary | ICD-10-CM

## 2021-06-09 DIAGNOSIS — L84 Corns and callosities: Secondary | ICD-10-CM

## 2021-06-09 DIAGNOSIS — M216X1 Other acquired deformities of right foot: Secondary | ICD-10-CM | POA: Diagnosis not present

## 2021-06-09 DIAGNOSIS — M21619 Bunion of unspecified foot: Secondary | ICD-10-CM

## 2021-06-10 ENCOUNTER — Ambulatory Visit: Payer: Medicare Other | Admitting: Internal Medicine

## 2021-06-12 NOTE — Progress Notes (Signed)
Subjective:   Patient ID: Patricia Dillon, female   DOB: 71 y.o.   MRN: [id number]   HPI 71 year old female presents the office today for concerns of corns and calluses.  She has obtained her big toe and second toe but also in the ball of her foot.  She previously had submetatarsal 2 but not causing significant pain with that but she does get a painful callus submetatarsal 5 on the right foot.  She states that she has had injections to the calluses before which has been helpful.  There is an ongoing about 3 years.  More recently she has noticed a tight feeling in her toes and has difficulty bending her toes.  She feels that at times the skin is tight.  No recent injuries.  No swelling.  No open lesions.   Review of Systems  All other systems reviewed and are negative.  Past Medical History:  Diagnosis Date   External hemorrhoids    Hyperlipidemia    Lichen sclerosus    Mitral valve regurgitation    Osteopenia    SVT (supraventricular tachycardia) ([facility])    Vitamin B12 deficiency     Past Surgical History:  Procedure Laterality Date   BREAST BIOPSY Left    benign   BREAST EXCISIONAL BIOPSY Left      Current Outpatient Medications:    betamethasone dipropionate (DIPROLENE) 0.05 % ointment, Apply topically., Disp: , Rfl:    cholecalciferol (VITAMIN D) 1000 units tablet, Take 2,000 Units by mouth daily., Disp: , Rfl:    clobetasol cream (TEMOVATE) 7.65 %, Apply 1 application topically as needed., Disp: , Rfl:    vitamin B-12 (CYANOCOBALAMIN) 500 MCG tablet, Take 500 mcg by mouth daily., Disp: , Rfl:   Allergies  Allergen Reactions   Vancomycin Itching   Penicillins Rash          Objective:  Physical Exam  General: AAO x3, NAD  Dermatological: Hyperkeratotic lesion medial aspect of the hallux as well as lateral aspect second digit.  Hyperkeratotic lesion also minimal submetatarsal 2 left foot as well as right foot submetatarsal 5.  No open lesions.  Vascular: Dorsalis  Pedis artery and Posterior Tibial artery pedal pulses are 2/4 bilateral with immedate capillary fill time.  There is no pain with calf compression, swelling, warmth, erythema.   Neruologic: Grossly intact via light touch bilateral.   Musculoskeletal: Bunion present left foot, tailor's bunion right foot.  Prominent metatarsal heads.  Muscular strength 5/5 in all groups tested bilateral.  Gait: Unassisted, Nonantalgic.       Assessment:   Hyperkeratotic lesion, prominent metatarsal head, digital deformity     Plan:  -Treatment options discussed including all alternatives, risks, and complications -Etiology of symptoms were discussed -X-rays were obtained and reviewed with the patient.  3 views of bilateral feet were obtained.  No evidence of acute fracture.  Mild hallux abductus is present and medial deviation of second digit. -Attributed hyperkeratotic lesions 1 complications or bleeding.  Recommend moisturizer and offloading for the submetatarsal calluses.  Dispensed toe separators between her big toe and second toe.  Discussed offloading pads.  Also discussed the cushioning, offloading insert.  We will check on this and let her know the cost.  Trula Slade DPM

## 2021-06-13 ENCOUNTER — Encounter: Payer: Self-pay | Admitting: Internal Medicine

## 2021-06-13 ENCOUNTER — Ambulatory Visit (INDEPENDENT_AMBULATORY_CARE_PROVIDER_SITE_OTHER): Payer: Medicare Other | Admitting: Internal Medicine

## 2021-06-13 VITALS — BP 114/62 | HR 60 | Resp 18 | Ht 66.25 in | Wt 118.8 lb

## 2021-06-13 DIAGNOSIS — E559 Vitamin D deficiency, unspecified: Secondary | ICD-10-CM

## 2021-06-13 DIAGNOSIS — E782 Mixed hyperlipidemia: Secondary | ICD-10-CM

## 2021-06-13 DIAGNOSIS — M8580 Other specified disorders of bone density and structure, unspecified site: Secondary | ICD-10-CM

## 2021-06-13 DIAGNOSIS — E041 Nontoxic single thyroid nodule: Secondary | ICD-10-CM | POA: Diagnosis not present

## 2021-06-13 LAB — COMPREHENSIVE METABOLIC PANEL
ALT: 23 U/L (ref 0–35)
AST: 32 U/L (ref 0–37)
Albumin: 4.4 g/dL (ref 3.5–5.2)
Alkaline Phosphatase: 94 U/L (ref 39–117)
BUN: 19 mg/dL (ref 6–23)
CO2: 29 mEq/L (ref 19–32)
Calcium: 9.6 mg/dL (ref 8.4–10.5)
Chloride: 103 mEq/L (ref 96–112)
Creatinine, Ser: 0.92 mg/dL (ref 0.40–1.20)
GFR: 63.51 mL/min (ref >60.00)
Glucose, Bld: 96 mg/dL (ref 70–99)
Potassium: 4 mEq/L (ref 3.5–5.1)
Sodium: 141 mEq/L (ref 135–145)
Total Bilirubin: 0.9 mg/dL (ref 0.2–1.2)
Total Protein: 7.4 g/dL (ref 6.0–8.3)

## 2021-06-13 LAB — CBC
HCT: 39.7 % (ref 36.0–46.0)
Hemoglobin: 13.3 g/dL (ref 12.0–15.0)
MCHC: 33.4 g/dL (ref 30.0–36.0)
MCV: 92.2 fl (ref 78.0–100.0)
Platelets: 103 10*3/uL — ABNORMAL LOW (ref 150.0–400.0)
RBC: 4.31 Mil/uL (ref 3.87–5.11)
RDW: 13.2 % (ref 11.5–15.5)
WBC: 4.6 10*3/uL (ref 4.0–10.5)

## 2021-06-13 LAB — LIPID PANEL
Cholesterol: 208 mg/dL — ABNORMAL HIGH (ref 0–200)
HDL: 66.2 mg/dL (ref >39.00)
LDL Cholesterol: 119 mg/dL — ABNORMAL HIGH (ref 0–99)
NonHDL: 142.16
Total CHOL/HDL Ratio: 3
Triglycerides: 114 mg/dL (ref 0.0–149.0)
VLDL: 22.8 mg/dL (ref 0.0–40.0)

## 2021-06-13 LAB — VITAMIN D 25 HYDROXY (VIT D DEFICIENCY, FRACTURES): VITD: 57.09 ng/mL (ref 30.00–100.00)

## 2021-06-13 LAB — T4, FREE: Free T4: 0.82 ng/dL (ref 0.60–1.60)

## 2021-06-13 LAB — TSH: TSH: 1.82 u[IU]/mL (ref 0.35–5.50)

## 2021-06-13 NOTE — Progress Notes (Signed)
   Subjective:   Patient ID: Patricia Dillon, female    DOB: March 10, 1950, 71 y.o.   MRN: [id number]  HPI The patient is a 71 YO female coming in for follow up.  Review of Systems  Constitutional: Negative.   HENT: Negative.    Eyes: Negative.   Respiratory:  Negative for cough, chest tightness and shortness of breath.   Cardiovascular:  Negative for chest pain, palpitations and leg swelling.  Gastrointestinal:  Negative for abdominal distention, abdominal pain, constipation, diarrhea, nausea and vomiting.  Musculoskeletal: Negative.   Skin: Negative.   Neurological: Negative.   Psychiatric/Behavioral: Negative.      Objective:  Physical Exam Constitutional:      Appearance: She is well-developed.  HENT:     Head: Normocephalic and atraumatic.  Cardiovascular:     Rate and Rhythm: Normal rate and regular rhythm.  Pulmonary:     Effort: Pulmonary effort is normal. No respiratory distress.     Breath sounds: Normal breath sounds. No wheezing or rales.  Abdominal:     General: Bowel sounds are normal. There is no distension.     Palpations: Abdomen is soft.     Tenderness: There is no abdominal tenderness. There is no rebound.  Musculoskeletal:     Cervical back: Normal range of motion.  Skin:    General: Skin is warm and dry.  Neurological:     Mental Status: She is alert and oriented to person, place, and time.     Coordination: Coordination normal.     Vitals:   06/13/21 0943  BP: 114/62  Pulse: 60  Resp: 18  SpO2: 99%  Weight: 118 lb 12.8 oz (53.9 kg)  Height: 5' 6.25" (1.683 m)    Assessment & Plan:

## 2021-06-13 NOTE — Patient Instructions (Signed)
We will check the labs today. 

## 2021-06-14 ENCOUNTER — Other Ambulatory Visit: Payer: Medicare Other

## 2021-06-14 DIAGNOSIS — H02055 Trichiasis without entropian left lower eyelid: Secondary | ICD-10-CM | POA: Diagnosis not present

## 2021-06-17 DIAGNOSIS — M8580 Other specified disorders of bone density and structure, unspecified site: Secondary | ICD-10-CM | POA: Insufficient documentation

## 2021-06-17 NOTE — Assessment & Plan Note (Signed)
Checking TSH and free T4 and will follow up on results of ultrasound. Not on medication and no compressive symptoms. Treat as appropriate.

## 2021-06-17 NOTE — Assessment & Plan Note (Signed)
Noted on last DEXA. Checking vitamin D and replete if low.

## 2021-06-17 NOTE — Assessment & Plan Note (Signed)
Checking lipid panel and adjust as needed. Not on medications currently using diet and exercise to help. Adjust as needed.

## 2021-06-27 ENCOUNTER — Ambulatory Visit
Admission: RE | Admit: 2021-06-27 | Discharge: 2021-06-27 | Disposition: A | Discharge status: Home / Self Care | Payer: Medicare Other | Source: Ambulatory Visit | Attending: Internal Medicine | Admitting: Internal Medicine

## 2021-06-27 DIAGNOSIS — E041 Nontoxic single thyroid nodule: Secondary | ICD-10-CM | POA: Diagnosis not present

## 2021-07-15 DIAGNOSIS — T1512XA Foreign body in conjunctival sac, left eye, initial encounter: Secondary | ICD-10-CM | POA: Diagnosis not present

## 2021-07-15 DIAGNOSIS — H0100B Unspecified blepharitis left eye, upper and lower eyelids: Secondary | ICD-10-CM | POA: Diagnosis not present

## 2021-07-15 DIAGNOSIS — H04122 Dry eye syndrome of left lacrimal gland: Secondary | ICD-10-CM | POA: Diagnosis not present

## 2021-08-15 ENCOUNTER — Ambulatory Visit (INDEPENDENT_AMBULATORY_CARE_PROVIDER_SITE_OTHER): Payer: Medicare Other

## 2021-08-15 ENCOUNTER — Ambulatory Visit (INDEPENDENT_AMBULATORY_CARE_PROVIDER_SITE_OTHER): Payer: Medicare Other | Admitting: Podiatry

## 2021-08-15 DIAGNOSIS — L84 Corns and callosities: Secondary | ICD-10-CM

## 2021-08-15 DIAGNOSIS — M7989 Other specified soft tissue disorders: Secondary | ICD-10-CM

## 2021-08-15 DIAGNOSIS — M216X1 Other acquired deformities of right foot: Secondary | ICD-10-CM | POA: Diagnosis not present

## 2021-08-15 NOTE — Progress Notes (Signed)
Subjective: Chief Complaint  Patient presents with   Plantar Warts    Pt came in today for plantar warts on the bottom of the right foot which started 2 weeks ago, patient states it feels like stepping on a small rock when walking, pt does have some sting and burning    71 y.o. female presents the office with above complaints.  States a couple weeks after her last appointment she started developing a new lesion on the bottom of her foot which is causing pain.  She gets some pain when of the fifth MPJ callus.  The area in the left foot is doing well no recurrence of the pain.  No open lesions.  No recent injuries or changes.  Denies stepping any foreign objects.  No swelling redness or drainage.  Objective: AAO x3, NAD DP/PT pulses palpable bilaterally, CRT less than 3 seconds Thick hyperkeratotic lesion on the right foot submetatarsal 3 as well as notably submetatarsal 5 on the right foot.  Upon debridement there is no underlying ulceration drainage or signs of infection.  Prominence of metatarsals plantarly with atrophy the fat pad. No pain with calf compression, swelling, warmth, erythema  Assessment: Hyperkeratotic lesion right foot due to prominent metatarsal heads  Plan: -All treatment options discussed with the patient including all alternatives, risks, complications.  -Sharply debrided x2 without any complications or bleeding as a courtesy.  Also discussed moisturizer, offloading to help take pressure off the ball of her foot.  When he tried over-the-counter inserts with modifications but if needed consider custom inserts.  This also seemed to worsen after she started doing a new exercise class.  Hopefully we can decrease the pressure these will continue to be ongoing issue -Patient encouraged to call the office with any questions, concerns, change in symptoms.   Trula Slade DPM

## 2021-08-15 NOTE — Patient Instructions (Signed)
For inserts I like Powersteps, Superfeet, Aetrex inserts If needed we can add a metatarsal pad to help get the pressure off the ball of the foot as well.

## 2021-08-19 ENCOUNTER — Ambulatory Visit
Admission: RE | Admit: 2021-08-19 | Discharge: 2021-08-19 | Disposition: A | Discharge status: Home / Self Care | Payer: Medicare Other | Source: Ambulatory Visit | Attending: Internal Medicine | Admitting: Internal Medicine

## 2021-08-19 DIAGNOSIS — Z1231 Encounter for screening mammogram for malignant neoplasm of breast: Secondary | ICD-10-CM | POA: Diagnosis not present

## 2021-08-22 ENCOUNTER — Other Ambulatory Visit: Payer: Self-pay | Admitting: Internal Medicine

## 2021-08-22 DIAGNOSIS — R928 Other abnormal and inconclusive findings on diagnostic imaging of breast: Secondary | ICD-10-CM

## 2021-08-25 DIAGNOSIS — H01001 Unspecified blepharitis right upper eyelid: Secondary | ICD-10-CM | POA: Diagnosis not present

## 2021-08-25 DIAGNOSIS — H01004 Unspecified blepharitis left upper eyelid: Secondary | ICD-10-CM | POA: Diagnosis not present

## 2021-08-25 IMAGING — US US THYROID
1 series · 13 of 25 positions shown · non-contrast
Comparison: 08/09/2019

CLINICAL DATA: Nodule follow-up

EXAM:
THYROID ULTRASOUND
TECHNIQUE: Ultrasound examination of the thyroid gland and adjacent soft
tissues was performed.

[Series 1: us thyroid · 0.07mm/px · 13 of 44 slices shown]
[im 1/44]
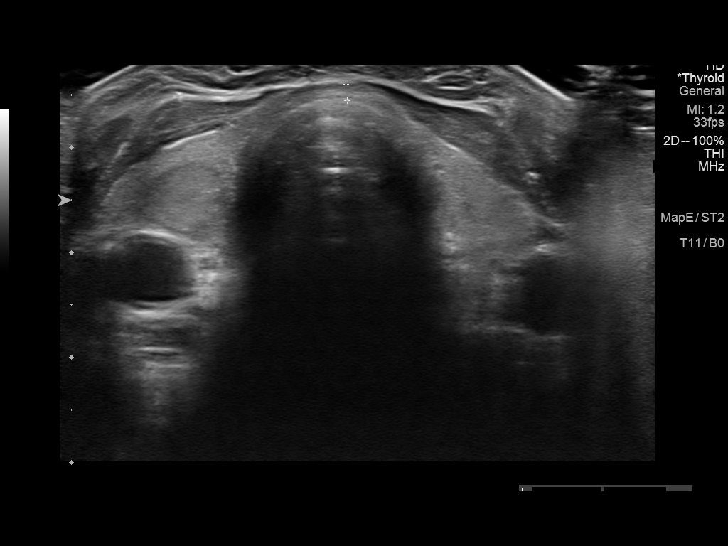
[im 4/44]
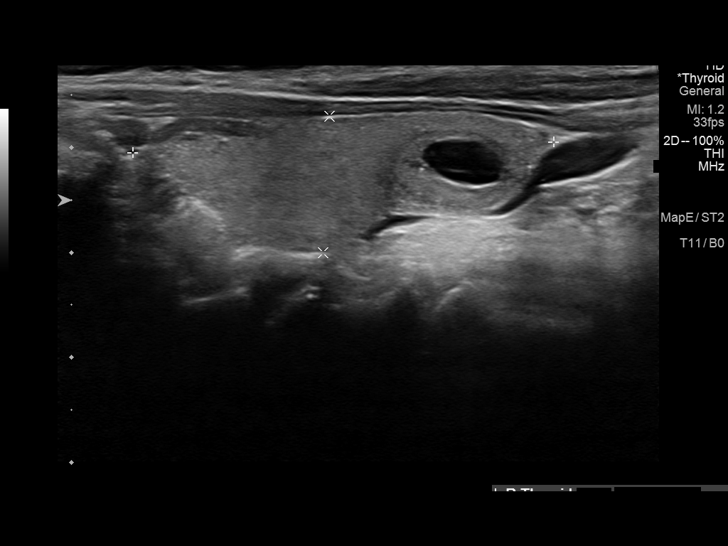
[im 8/44]
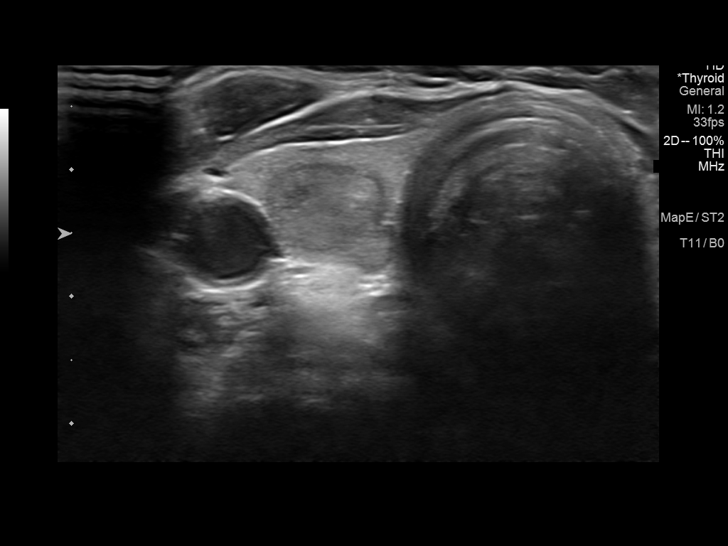
[im 11/44]
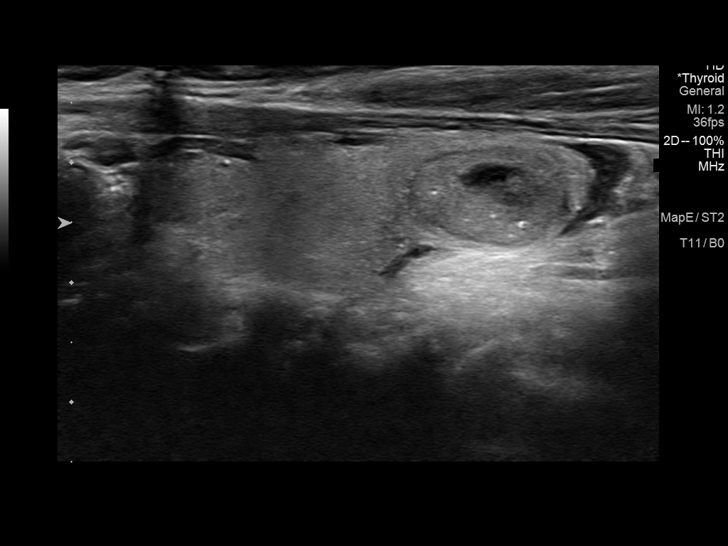
[im 15/44]
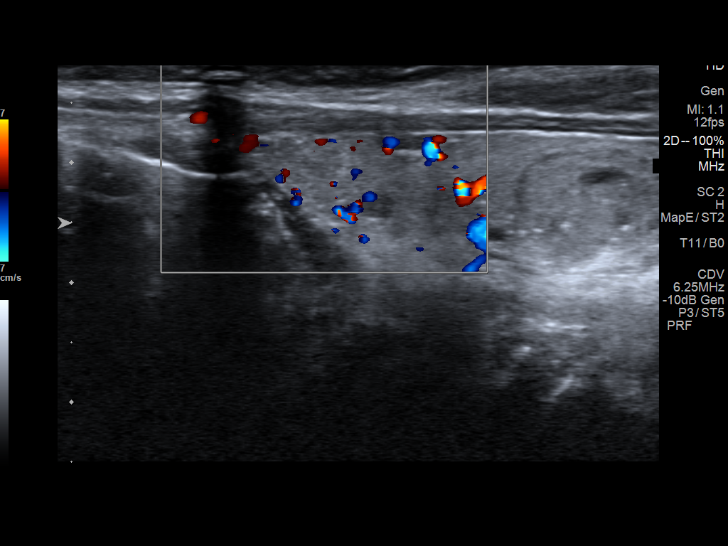
[im 18/44]
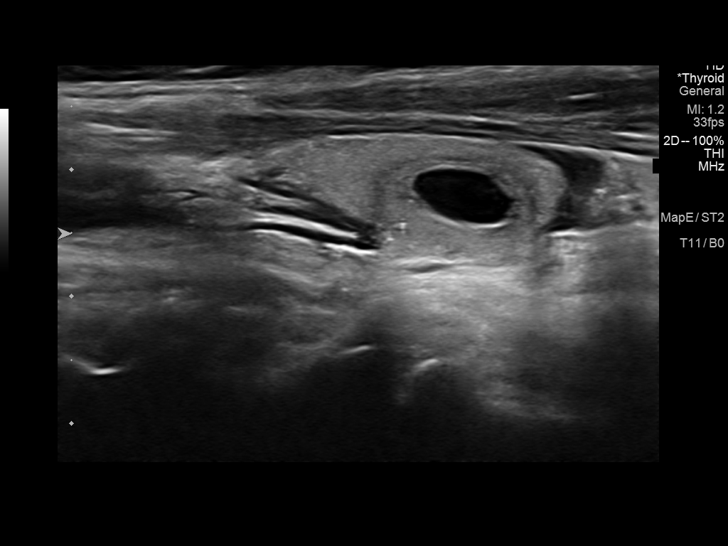
[im 22/44]
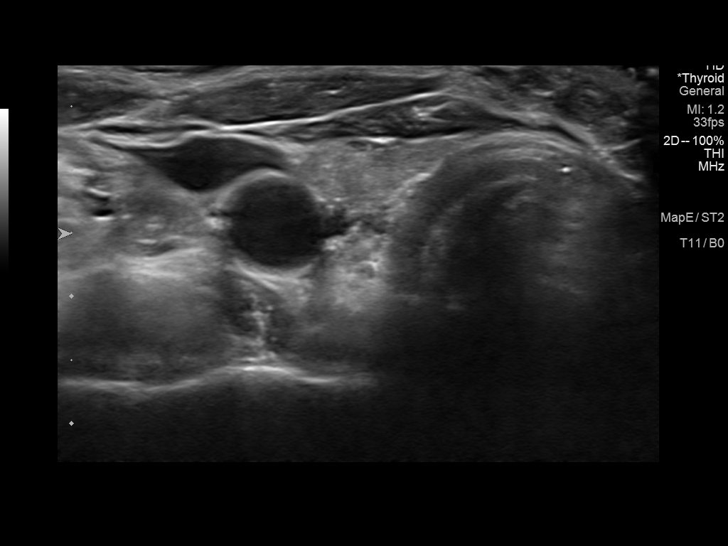
[im 26/44]
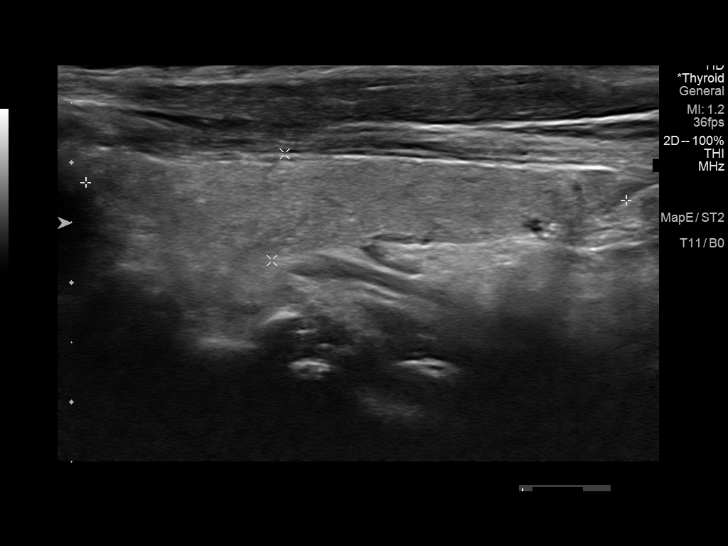
[im 29/44]
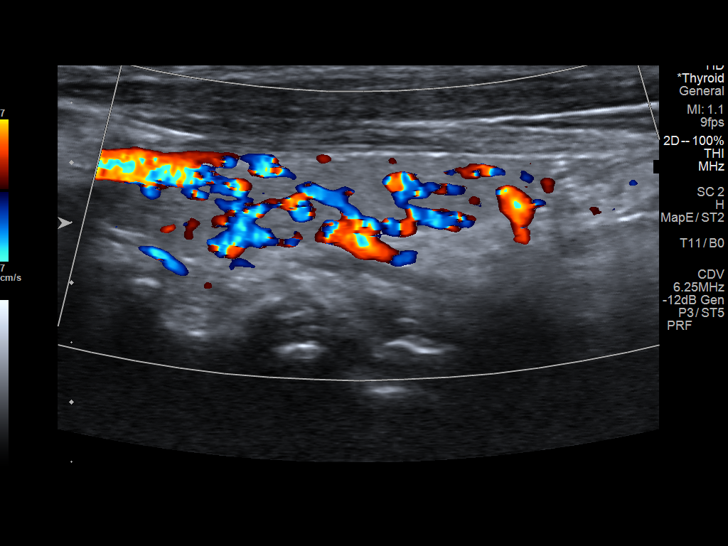
[im 33/44]
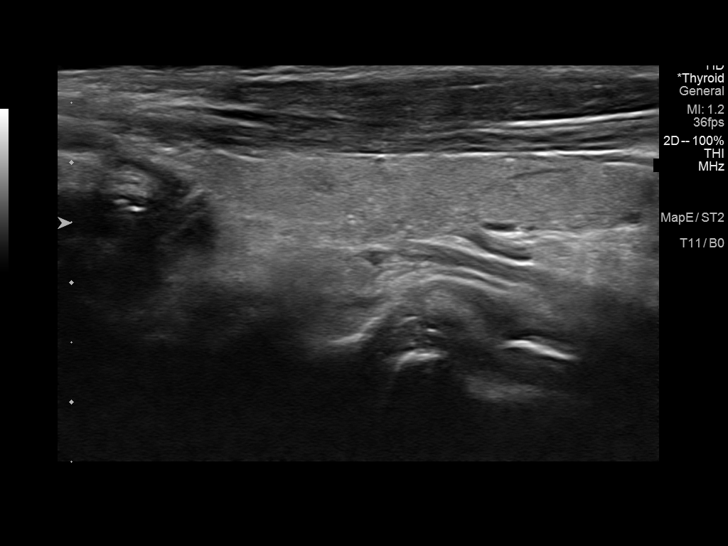
[im 36/44]
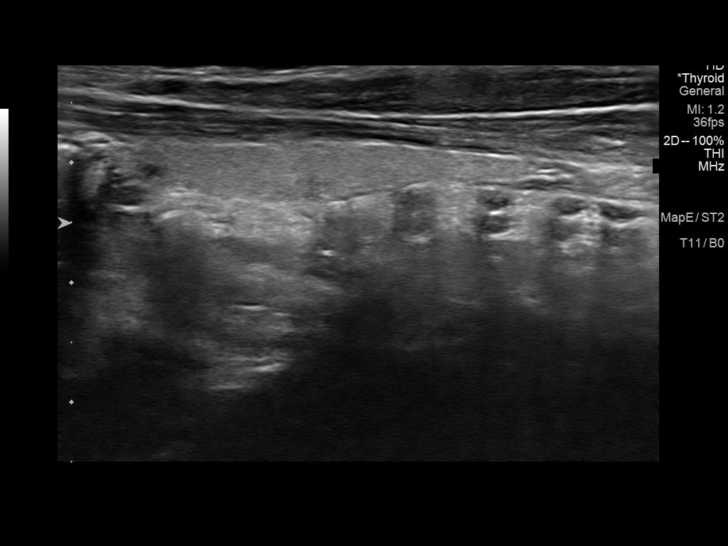
[im 40/44]
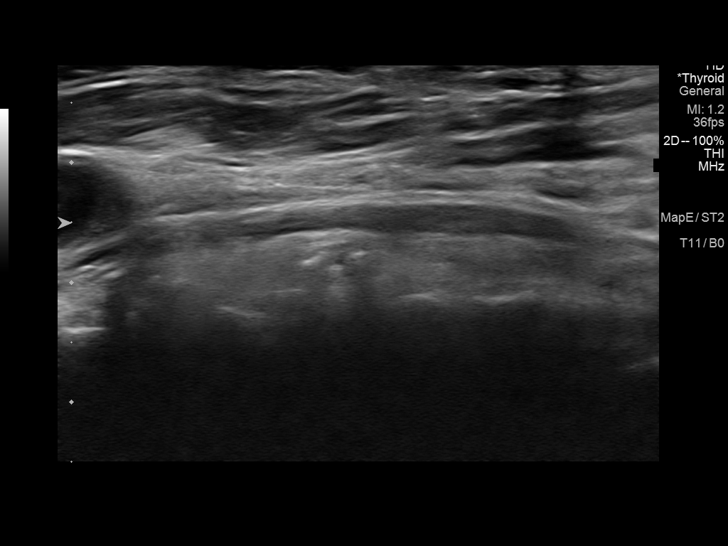
[im 44/44]
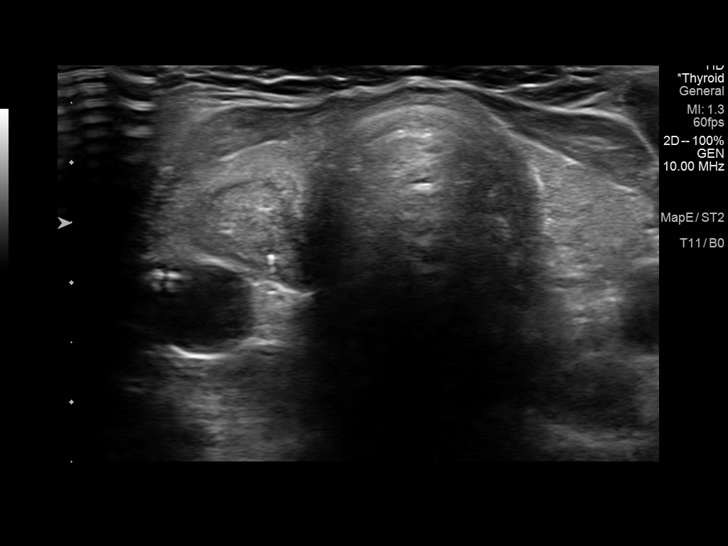

[13 of 25 positions shown; findings below may reference images not displayed]

FINDINGS: Parenchymal Echotexture: Mildly heterogeneous

Isthmus: 0.2 cm

Right lobe: 4.0 x 1.3 x 1.5 cm

Left lobe: 4.5 x 0.9 x 1.0 cm

_________________________________________________________

Estimated total number of nodules >/= 1 cm: 1

Number of spongiform nodules >/=  2 cm not described below (TR1): 0

Number of mixed cystic and solid nodules >/= 1.5 cm not described
below (TR2): 0

_________________________________________________________

Nodule # 1:

Prior biopsy: No

Location: Right; inferior

Maximum size: 1.3 cm; Other 2 dimensions: 0.9 x 0.8 cm, previously,
1.2 x 0.9 x 0.9 cm

Composition: solid/almost completely solid (2)

Echogenicity: isoechoic (1)

Shape: not taller-than-wide (0)

Margins: smooth (0)

Echogenic foci: punctate echogenic foci (3)

ACR TI-RADS total points: 6.

ACR TI-RADS risk category:  TR4 (4-6 points).

Significant change in size (>/= 20% in two dimensions and minimal
increase of 2 mm): No

Change in features: No

Change in ACR TI-RADS risk category: No

ACR TI-RADS recommendations:

*Given size (>/= 1 - 1.4 cm) and appearance, a follow-up ultrasound
in 1 year should be considered based on TI-RADS criteria.

_________________________________________________________
IMPRESSION: Solitary TI-RADS 4 right thyroid nodule is not significantly changed
in size since prior exam.

Continued annual follow-up recommended until 5 year stability is
confirmed.

The above is in keeping with the ACR TI-RADS recommendations - [HOSPITAL] 3327;[DATE].

## 2021-08-30 ENCOUNTER — Ambulatory Visit
Admission: RE | Admit: 2021-08-30 | Discharge: 2021-08-30 | Disposition: A | Discharge status: Home / Self Care | Payer: Medicare Other | Source: Ambulatory Visit | Attending: Internal Medicine | Admitting: Internal Medicine

## 2021-08-30 ENCOUNTER — Ambulatory Visit: Payer: Medicare Other

## 2021-08-30 DIAGNOSIS — R928 Other abnormal and inconclusive findings on diagnostic imaging of breast: Secondary | ICD-10-CM

## 2021-08-30 DIAGNOSIS — R922 Inconclusive mammogram: Secondary | ICD-10-CM | POA: Diagnosis not present

## 2021-08-31 DIAGNOSIS — H02055 Trichiasis without entropian left lower eyelid: Secondary | ICD-10-CM | POA: Diagnosis not present

## 2021-09-07 DIAGNOSIS — L821 Other seborrheic keratosis: Secondary | ICD-10-CM | POA: Diagnosis not present

## 2021-09-07 DIAGNOSIS — L438 Other lichen planus: Secondary | ICD-10-CM | POA: Diagnosis not present

## 2021-09-07 DIAGNOSIS — D2239 Melanocytic nevi of other parts of face: Secondary | ICD-10-CM | POA: Diagnosis not present

## 2021-09-07 DIAGNOSIS — L578 Other skin changes due to chronic exposure to nonionizing radiation: Secondary | ICD-10-CM | POA: Diagnosis not present

## 2021-09-07 DIAGNOSIS — L570 Actinic keratosis: Secondary | ICD-10-CM | POA: Diagnosis not present

## 2021-09-22 DIAGNOSIS — Z23 Encounter for immunization: Secondary | ICD-10-CM | POA: Diagnosis not present

## 2021-09-27 DIAGNOSIS — H01001 Unspecified blepharitis right upper eyelid: Secondary | ICD-10-CM | POA: Diagnosis not present

## 2021-09-27 DIAGNOSIS — H01004 Unspecified blepharitis left upper eyelid: Secondary | ICD-10-CM | POA: Diagnosis not present

## 2021-10-06 DIAGNOSIS — K146 Glossodynia: Secondary | ICD-10-CM | POA: Diagnosis not present

## 2021-10-13 DIAGNOSIS — Z23 Encounter for immunization: Secondary | ICD-10-CM | POA: Diagnosis not present

## 2021-10-17 DIAGNOSIS — H01001 Unspecified blepharitis right upper eyelid: Secondary | ICD-10-CM | POA: Diagnosis not present

## 2021-10-17 DIAGNOSIS — H01004 Unspecified blepharitis left upper eyelid: Secondary | ICD-10-CM | POA: Diagnosis not present

## 2021-11-10 DIAGNOSIS — H2513 Age-related nuclear cataract, bilateral: Secondary | ICD-10-CM | POA: Diagnosis not present

## 2021-12-01 ENCOUNTER — Encounter: Payer: Self-pay | Admitting: Internal Medicine

## 2021-12-12 ENCOUNTER — Encounter: Payer: Self-pay | Admitting: *Deleted

## 2021-12-13 DIAGNOSIS — K649 Unspecified hemorrhoids: Secondary | ICD-10-CM | POA: Diagnosis not present

## 2021-12-27 ENCOUNTER — Encounter: Payer: Self-pay | Admitting: Internal Medicine

## 2021-12-27 ENCOUNTER — Other Ambulatory Visit (INDEPENDENT_AMBULATORY_CARE_PROVIDER_SITE_OTHER): Payer: Medicare Other

## 2021-12-27 ENCOUNTER — Ambulatory Visit (INDEPENDENT_AMBULATORY_CARE_PROVIDER_SITE_OTHER): Payer: Medicare Other | Admitting: Internal Medicine

## 2021-12-27 VITALS — BP 118/82 | HR 59 | Ht 66.25 in | Wt 120.0 lb

## 2021-12-27 DIAGNOSIS — K648 Other hemorrhoids: Secondary | ICD-10-CM

## 2021-12-27 DIAGNOSIS — R195 Other fecal abnormalities: Secondary | ICD-10-CM

## 2021-12-27 LAB — CBC WITH DIFFERENTIAL/PLATELET
Basophils Absolute: 0 10*3/uL (ref 0.0–0.1)
Basophils Relative: 0.7 % (ref 0.0–3.0)
Eosinophils Absolute: 0.2 10*3/uL (ref 0.0–0.7)
Eosinophils Relative: 2.8 % (ref 0.0–5.0)
HCT: 41 % (ref 36.0–46.0)
Hemoglobin: 13.7 g/dL (ref 12.0–15.0)
Lymphocytes Relative: 19.5 % (ref 12.0–46.0)
Lymphs Abs: 1.2 10*3/uL (ref 0.7–4.0)
MCHC: 33.3 g/dL (ref 30.0–36.0)
MCV: 91.2 fl (ref 78.0–100.0)
Monocytes Absolute: 0.4 10*3/uL (ref 0.1–1.0)
Monocytes Relative: 6.8 % (ref 3.0–12.0)
Neutro Abs: 4.2 10*3/uL (ref 1.4–7.7)
Neutrophils Relative %: 70.2 % (ref 43.0–77.0)
Platelets: 132 10*3/uL — ABNORMAL LOW (ref 150.0–400.0)
RBC: 4.5 Mil/uL (ref 3.87–5.11)
RDW: 13.5 % (ref 11.5–15.5)
WBC: 6 10*3/uL (ref 4.0–10.5)

## 2021-12-27 MED ORDER — NA SULFATE-K SULFATE-MG SULF 17.5-3.13-1.6 GM/177ML PO SOLN: ORAL | 0 refills | Status: DC

## 2021-12-27 NOTE — Patient Instructions (Addendum)
Please purchase the following medications over the counter and take as directed: Start metamucil 1-2 tsp daily   Your provider has requested that you go to the basement level for lab work before leaving today. Press "B" on the elevator. The lab is located at the first door on the left as you exit the elevator.   You have been scheduled for a colonoscopy. Please follow written instructions given to you at your visit today.  Please pick up your prep supplies at the pharmacy within the next 1-3 days. If you use inhalers (even only as needed), please bring them with you on the day of your procedure.   Due to recent changes in healthcare laws, you may see the results of your imaging and laboratory studies on MyChart before your provider has had a chance to review them.  We understand that in some cases there may be results that are confusing or concerning to you. Not all laboratory results come back in the same time frame and the provider may be waiting for multiple results in order to interpret others.  Please give [place] 48 hours in order for your provider to thoroughly review all the results before contacting the office for clarification of your results.    Thank you for entrusting me with your care and choosing [facility].  Dr Hilarie Fredrickson

## 2021-12-27 NOTE — Progress Notes (Signed)
Subjective:    Patient ID: Patricia Dillon, female    DOB: February 10, 1950, 71 y.o.   MRN: [id number]  HPI Patricia Dillon is a 71 year old female with a history of adenomatous colon polyps, symptomatic hemorrhoids, lichen sclerosis, T61 deficiency and hyperlipidemia who is here for follow-up to discuss bleeding and prolapsing internal hemorrhoids.  She is here alone today and was last seen in August 2022 by Tye Savoy, NP.  She reports that she continues to have issues with painless rectal bleeding.  This is associated with bowel movement.  She also feels prolapsing and bulging perianal tissue which makes it hard for her to clean after bowel movement.  She has tried sitz bath but this seems to make fecal urgency occur.  She does occasionally see blood in her underwear.  From a bowel movement perspective she is never constipated stools vary from normal to loose.  Usually she has 1 or 2 bowel movements in the morning but occasionally has additional bowel movement later in the day.  At times it feels like it takes longer for her to complete her bowel movement.  She does have longstanding bloating symptom and loose stools.  Years ago she was told this may relate to IBS.  She has been seen by Dr. Julien Girt with gynecology and was told that none of her bleeding was gynecologic.   Review of Systems As per HPI, otherwise negative  Current Medications, Allergies, Past Medical History, Past Surgical History, Family History and Social History were reviewed in [employer] record.    Objective:   Physical Exam BP 118/82   Pulse (!) 59   Ht 5' 6.25" (1.683 m)   Wt 120 lb (54.4 kg)   SpO2 97%   BMI 19.22 kg/m  Gen: awake, alert, NAD HEENT: anicteric Neuro: nonfocal     Assessment & Plan:  71 year old female with a history of adenomatous colon polyps, symptomatic hemorrhoids, lichen sclerosis, W43 deficiency and hyperlipidemia who is here for follow-up to discuss bleeding and  prolapsing internal hemorrhoids.   Symptomatic hemorrhoids with bleeding and prolapse --we discussed her symptoms at length today which sound most consistent with prolapse and bleeding internal hemorrhoids.  I explained the difference between internal and external hemorrhoids.  She did not try the hydrocortisone suggested by Nevin Bloodgood after her last visit because symptoms got better for a period.  Loose stools are likely exacerbating symptoms.  We did discuss options including banding and surgical hemorrhoidectomy.  She prefers for the exam to take place during upcoming colonoscopy and so rectal exam not performed today. -- Evaluation with rectal exam and at time of colonoscopy in February -- May benefit from hemorrhoidal banding in the future versus surgical referral; she hopes for the former -- Attempts to bulk stool and avoid loose stools -- Check CBC to ensure no anemia  2.  Loose stool with bloating --irritable bowel suspected.  Cannot exclude SIBO. -- Begin Metamucil 1 teaspoon working to 2 teaspoons daily -- Possible rifaximin after colonoscopy depending on findings and response to fiber  3.  History of multiple adenomatous polyps --surveillance colonoscopy recommended in February 2024.  This is already scheduled.  She will be instructed for this procedure today.  We reviewed the risk, benefits and alternatives and she is agreeable and wishes to proceed. -- Colonoscopy in the [facility] as scheduled on 03/02/2022  30 minutes total spent today including patient facing time, coordination of care, reviewing medical history/procedures/pertinent radiology studies, and documentation of the encounter.

## 2022-01-09 ENCOUNTER — Ambulatory Visit: Payer: Medicare Other | Admitting: Internal Medicine

## 2022-01-10 DIAGNOSIS — H1045 Other chronic allergic conjunctivitis: Secondary | ICD-10-CM | POA: Diagnosis not present

## 2022-01-10 DIAGNOSIS — H01001 Unspecified blepharitis right upper eyelid: Secondary | ICD-10-CM | POA: Diagnosis not present

## 2022-03-02 ENCOUNTER — Encounter: Payer: Self-pay | Admitting: Internal Medicine

## 2022-03-02 ENCOUNTER — Ambulatory Visit (AMBULATORY_SURGERY_CENTER): Payer: Medicare Other | Admitting: Internal Medicine

## 2022-03-02 VITALS — BP 124/58 | HR 54 | Temp 97.8°F | Resp 14 | Ht 66.25 in | Wt 120.0 lb

## 2022-03-02 DIAGNOSIS — Z09 Encounter for follow-up examination after completed treatment for conditions other than malignant neoplasm: Secondary | ICD-10-CM | POA: Diagnosis not present

## 2022-03-02 DIAGNOSIS — K621 Rectal polyp: Secondary | ICD-10-CM | POA: Diagnosis not present

## 2022-03-02 DIAGNOSIS — Z86010 Personal history of colonic polyps: Secondary | ICD-10-CM | POA: Diagnosis not present

## 2022-03-02 DIAGNOSIS — K6289 Other specified diseases of anus and rectum: Secondary | ICD-10-CM

## 2022-03-02 MED ORDER — SODIUM CHLORIDE 0.9 % IV SOLN: 500.0000 mL | Freq: Once | INTRAVENOUS | Status: DC

## 2022-03-02 NOTE — Progress Notes (Signed)
GASTROENTEROLOGY PROCEDURE H&P NOTE   Primary Care Physician: Hoyt Koch, MD    Reason for Procedure:   Hx of adenomatous colon polyps  Plan:    colonoscopy  Patient is appropriate for endoscopic procedure(s) in the ambulatory ([facility]) setting.  The nature of the procedure, as well as the risks, benefits, and alternatives were carefully and thoroughly reviewed with the patient. Ample time for discussion and questions allowed. The patient understood, was satisfied, and agreed to proceed.     HPI: Patricia Dillon is a 72 y.o. female who presents for surveillance colonoscopy.  Medical history as below.  Tolerated the prep.  No recent chest pain or shortness of breath.  No abdominal pain today.  Past Medical History:  Diagnosis Date   Diverticulosis    External hemorrhoids    Hyperlipidemia    Internal hemorrhoids    Lichen sclerosus    Mitral valve regurgitation    Osteopenia    SVT (supraventricular tachycardia)    Tubular adenoma of colon    Vitamin B12 deficiency     Past Surgical History:  Procedure Laterality Date   BREAST BIOPSY Left    benign   BREAST EXCISIONAL BIOPSY Left    COLONOSCOPY     2002, 2006, 2012 at [place]    Prior to Admission medications   Medication Sig Start Date End Date Taking? Authorizing Provider  vitamin B-12 (CYANOCOBALAMIN) 500 MCG tablet Take 500 mcg by mouth daily. Brand: Megafood, includes Folate 340 mcg, vit B6 - 8 mg, includes food blend   Yes [provider]  betamethasone dipropionate (DIPROLENE) 0.05 % ointment Apply topically. 06/08/20   [provider]  Cholecalciferol (VITAMIN D3) 50 MCG (2000 UT) capsule Take 2,000 Units by mouth daily.    [provider]  clobetasol cream (TEMOVATE) [id number] % Apply 1 application topically as needed. Patient not taking: Reported on 03/02/2022    [provider]  XDEMVY 0.25 % SOLN Place 1 drop into both eyes in the morning and at bedtime. Ends on  12/30/21. Patient not taking: Reported on 03/02/2022    [provider]    Current Outpatient Medications  Medication Sig Dispense Refill   vitamin B-12 (CYANOCOBALAMIN) 500 MCG tablet Take 500 mcg by mouth daily. Brand: Megafood, includes Folate 340 mcg, vit B6 - 8 mg, includes food blend     betamethasone dipropionate (DIPROLENE) 0.05 % ointment Apply topically.     Cholecalciferol (VITAMIN D3) 50 MCG (2000 UT) capsule Take 2,000 Units by mouth daily.     clobetasol cream (TEMOVATE) [id number] % Apply 1 application topically as needed. (Patient not taking: Reported on 03/02/2022)     XDEMVY 0.25 % SOLN Place 1 drop into both eyes in the morning and at bedtime. Ends on 12/30/21. (Patient not taking: Reported on 03/02/2022)     Current Facility-Administered Medications  Medication Dose Route Frequency Provider Last Rate Last Admin   0.9 %  sodium chloride infusion  500 mL Intravenous Once Raea Magallon, Lajuan Lines, MD        Allergies as of 03/02/2022 - Review Complete 03/02/2022  Allergen Reaction Noted   Vancomycin Itching 12/30/2013   Penicillins Rash 07/26/2012    Family History  Problem Relation Age of Onset   Hypertension Mother    Heart disease Father    Melanoma Daughter        age 40   Colon polyps Daughter    Colon polyps Son    Colon cancer Neg  Hx    Esophageal cancer Neg Hx    Rectal cancer Neg Hx    Ulcerative colitis Neg Hx     Social History   Socioeconomic History   Marital status: Divorced    Spouse name: Not on file   Number of children: Not on file   Years of education: Not on file   Highest education level: Not on file  Occupational History   Not on file  Tobacco Use   Smoking status: Never   Smokeless tobacco: Never  Vaping Use   Vaping Use: Never used  Substance and Sexual Activity   Alcohol use: No    Alcohol/week: 0.0 standard drinks of alcohol   Drug use: No   Sexual activity: Not on file  Other Topics Concern   Not on file  Social History  Narrative   Not on file   Social Determinants of Health   Financial Resource Strain: Low Risk  (05/17/2021)   Overall Financial Resource Strain (CARDIA)    Difficulty of Paying Living Expenses: Not hard at all  Food Insecurity: No Food Insecurity (05/17/2021)   Hunger Vital Sign    Worried About Running Out of Food in the Last Year: Never true    [place] in the Last Year: Never true  Transportation Needs: No Transportation Needs (05/17/2021)   PRAPARE - Hydrologist (Medical): No    Lack of Transportation (Non-Medical): No  Physical Activity: Sufficiently Active (05/17/2021)   Exercise Vital Sign    Days of Exercise per Week: 4 days    Minutes of Exercise per Session: 60 min  Stress: No Stress Concern Present (05/17/2021)   [place]    Feeling of Stress : Not at all  Social Connections: Moderately Isolated (05/17/2021)   Social Connection and Isolation Panel [NHANES]    Frequency of Communication with Friends and Family: More than three times a week    Frequency of Social Gatherings with Friends and Family: More than three times a week    Attends Religious Services: Never    Marine scientist or Organizations: No    Attends Music therapist: More than 4 times per year    Marital Status: Divorced  Human resources officer Violence: Not At Risk (05/17/2021)   Humiliation, Afraid, Rape, and Kick questionnaire    Fear of Current or Ex-Partner: No    Emotionally Abused: No    Physically Abused: No    Sexually Abused: No    Physical Exam: Vital signs in last 24 hours: '@BP'$  (!) 157/81   Pulse 87   Temp 97.8 F (36.6 C) (Skin)   Resp 18   Ht 5' 6.25" (1.683 m)   Wt 120 lb (54.4 kg)   SpO2 97%   BMI 19.22 kg/m  GEN: NAD EYE: Sclerae anicteric ENT: MMM CV: Non-tachycardic Pulm: CTA b/l GI: Soft, NT/ND NEURO:  Alert & Oriented x 3   Zenovia Jarred, MD [facility]  03/02/2022 1:30 PM

## 2022-03-02 NOTE — Progress Notes (Signed)
Pt's states no medical or surgical changes since previsit or office visit. 

## 2022-03-02 NOTE — Progress Notes (Signed)
Called to room to assist during endoscopic procedure.  Patient ID and intended procedure confirmed with present staff. Received instructions for my participation in the procedure from the performing physician.  

## 2022-03-02 NOTE — Progress Notes (Signed)
Sedate, gd SR's, VSS, report to RN 

## 2022-03-02 NOTE — Op Note (Signed)
[facility] Patient Name: Patricia Dillon Procedure Date: 03/02/2022 1:23 PM MRN: [id number] Endoscopist: Jerene Bears , MD, [id number] Age: 72 Referring MD:  Date of Birth: 1950-03-01 Gender: Female Account #: [id number] Procedure:                Colonoscopy Indications:              High risk colon cancer surveillance: Personal                            history of multiple adenomas (5 adenomas at last                            exam), Last colonoscopy: February 2021 Medicines:                Monitored Anesthesia Care Procedure:                Pre-Anesthesia Assessment:                           - Prior to the procedure, a History and Physical                            was performed, and patient medications and                            allergies were reviewed. The patient's tolerance of                            previous anesthesia was also reviewed. The risks                            and benefits of the procedure and the sedation                            options and risks were discussed with the patient.                            All questions were answered, and informed consent                            was obtained. Prior Anticoagulants: The patient has                            taken no anticoagulant or antiplatelet agents. ASA                            Grade Assessment: II - A patient with mild systemic                            disease. After reviewing the risks and benefits,                            the patient was deemed in satisfactory condition to  undergo the procedure.                           After obtaining informed consent, the colonoscope                            was passed under direct vision. Throughout the                            procedure, the patient's blood pressure, pulse, and                            oxygen saturations were monitored continuously. The                            Olympus PCF-H190DL  [id number]) Colonoscope was                            introduced through the anus and advanced to the                            cecum, identified by appendiceal orifice and                            ileocecal valve. The colonoscopy was performed                            without difficulty. The patient tolerated the                            procedure well. The quality of the bowel                            preparation was excellent. The ileocecal valve,                            appendiceal orifice, and rectum were photographed. Scope In: 1:34:33 PM Scope Out: 1:47:17 PM Scope Withdrawal Time: 0 hours 9 minutes 54 seconds  Total Procedure Duration: 0 hours 12 minutes 44 seconds  Findings:                 Hemorrhoids were found on perianal exam.                           A few small-mouthed diverticula were found in the                            sigmoid colon.                           An area of nodular and inflamed mucosa was found in                            the distal rectum (right posterior). Biopsies were  taken with a cold forceps for histology to exclude                            adenoma versus prolapse change.                           Internal hemorrhoids were found during retroflexion                            and during digital exam. The hemorrhoids were                            medium-sized. Complications:            No immediate complications. Estimated Blood Loss:     Estimated blood loss was minimal. Impression:               - Mild diverticulosis in the sigmoid colon.                           - Nodular mucosa in the distal rectum. Biopsied to                            exclude adenoma (prolapse favored).                           - Internal hemorrhoids.                           - No colonic polyps seen. Recommendation:           - Patient has a contact number available for                            emergencies. The signs and  symptoms of potential                            delayed complications were discussed with the                            patient. Return to normal activities tomorrow.                            Written discharge instructions were provided to the                            patient.                           - Resume previous diet.                           - Continue present medications.                           - Await pathology results.                           -  Patient scheduled for hemorrhoidal banding.                           - Repeat colonoscopy in 5 years for surveillance. Jerene Bears, MD 03/02/2022 1:52:27 PM This report has been signed electronically.

## 2022-03-02 NOTE — Patient Instructions (Addendum)
Recommendation- Patient has a contact number available for                            emergencies. The signs and symptoms of potential                            delayed complications were discussed with the                            patient. Return to normal activities tomorrow.                            Written discharge instructions were provided to the                            patient.                           - Resume previous diet.                           - Continue present medications.                           - Await pathology results.                           - Patient scheduled for hemorrhoidal banding.                           - Repeat colonoscopy in 5 years for surveillance.  Handouts on diverticulosis and hemorrhoids given.  YOU HAD AN ENDOSCOPIC PROCEDURE TODAY AT [facility]:   Refer to the procedure report that was given to you for any specific questions about what was found during the examination.  If the procedure report does not answer your questions, please call your gastroenterologist to clarify.  If you requested that your care partner not be given the details of your procedure findings, then the procedure report has been included in a sealed envelope for you to review at your convenience later.  YOU SHOULD EXPECT: Some feelings of bloating in the abdomen. Passage of more gas than usual.  Walking can help get rid of the air that was put into your GI tract during the procedure and reduce the bloating. If you had a lower endoscopy (such as a colonoscopy or flexible sigmoidoscopy) you may notice spotting of blood in your stool or on the toilet paper. If you underwent a bowel prep for your procedure, you may not have a normal bowel movement for a few days.  Please Note:  You might notice some irritation and congestion in your nose or some drainage.  This is from the oxygen used during your procedure.  There is no need for concern and it should clear up  in a day or so.  SYMPTOMS TO REPORT IMMEDIATELY:  Following lower endoscopy (colonoscopy or flexible sigmoidoscopy):  Excessive amounts of blood in the stool  Significant tenderness or worsening of abdominal pains  Swelling of the abdomen that is new, acute  Fever of 100F or higher  For urgent  or emergent issues, a gastroenterologist can be reached at any hour by calling [phone]. Do not use MyChart messaging for urgent concerns.   DIET:  We do recommend a small meal at first, but then you may proceed to your regular diet.  Drink plenty of fluids but you should avoid alcoholic beverages for 24 hours.  ACTIVITY:  You should plan to take it easy for the rest of today and you should NOT DRIVE or use heavy machinery until tomorrow (because of the sedation medicines used during the test).    FOLLOW UP: Our staff will call the number listed on your records the next business day following your procedure.  We will call around 7:15- 8:00 am to check on you and address any questions or concerns that you may have regarding the information given to you following your procedure. If we do not reach you, we will leave a message.     If any biopsies were taken you will be contacted by phone or by letter within the next 1-3 weeks.  Please call us at [phone] if you have not heard about the biopsies in 3 weeks.   SIGNATURES/CONFIDENTIALITY: You and/or your care partner have signed paperwork which will be entered into your electronic medical record.  These signatures attest to the fact that that the information above on your After Visit Summary has been reviewed and is understood.  Full responsibility of the confidentiality of this discharge information lies with you and/or your care-partner.

## 2022-03-03 ENCOUNTER — Telehealth: Payer: Self-pay | Admitting: Internal Medicine

## 2022-03-03 ENCOUNTER — Telehealth: Payer: Self-pay

## 2022-03-03 NOTE — Telephone Encounter (Signed)
Inbound call from pt , she had a procedure yesterday and have questing regarding her "stool" for this morning .Please advise

## 2022-03-03 NOTE — Telephone Encounter (Signed)
  Follow up Call-     03/02/2022   12:50 PM  Call back number  Post procedure Call Back phone  # [phone]  Permission to leave phone message Yes     Patient questions:  Do you have a fever, pain , or abdominal swelling? No. Pain Score  0 *  Have you tolerated food without any problems? Yes.    Have you been able to return to your normal activities? Yes.    Do you have any questions about your discharge instructions: Diet   No. Medications  No. Follow up visit  No.  Do you have questions or concerns about your Care? No.  Actions: * If pain score is 4 or above: No action needed, pain <4.

## 2022-03-03 NOTE — Telephone Encounter (Signed)
Pt states that she had a BM this morning and had some mucus mixed with a little blood.  She then had a more brown stool with a little blood as well.  She is having no other symptoms.  Explained that this is normal and to keep an eye on things over the next few days.  She will call back if the bleeding increases or she develops other symptoms.

## 2022-03-06 DIAGNOSIS — H5712 Ocular pain, left eye: Secondary | ICD-10-CM | POA: Diagnosis not present

## 2022-03-06 DIAGNOSIS — H02055 Trichiasis without entropian left lower eyelid: Secondary | ICD-10-CM | POA: Diagnosis not present

## 2022-03-09 ENCOUNTER — Encounter: Payer: Self-pay | Admitting: Internal Medicine

## 2022-03-09 DIAGNOSIS — M25512 Pain in left shoulder: Secondary | ICD-10-CM | POA: Diagnosis not present

## 2022-03-23 DIAGNOSIS — H02055 Trichiasis without entropian left lower eyelid: Secondary | ICD-10-CM | POA: Diagnosis not present

## 2022-03-23 DIAGNOSIS — H5712 Ocular pain, left eye: Secondary | ICD-10-CM | POA: Diagnosis not present

## 2022-04-07 ENCOUNTER — Ambulatory Visit (INDEPENDENT_AMBULATORY_CARE_PROVIDER_SITE_OTHER): Payer: Medicare Other | Admitting: Internal Medicine

## 2022-04-07 ENCOUNTER — Encounter: Payer: Self-pay | Admitting: Internal Medicine

## 2022-04-07 VITALS — BP 140/80 | HR 83 | Temp 98.6°F | Ht 66.2 in | Wt 119.0 lb

## 2022-04-07 DIAGNOSIS — J069 Acute upper respiratory infection, unspecified: Secondary | ICD-10-CM

## 2022-04-07 NOTE — Assessment & Plan Note (Signed)
Overall resolving. Asked to use otc if needed. Vitamin C and zinc to help with immune system.

## 2022-04-07 NOTE — Progress Notes (Signed)
   Subjective:   Patient ID: Patricia Dillon, female    DOB: 1950/05/06, 72 y.o.   MRN: [id number]  Chest Pain  Associated symptoms include a cough. Pertinent negatives include no abdominal pain, nausea, palpitations, shortness of breath or vomiting.   The patient is a 72 YO female coming in for about 9 days URI symptoms.  Review of Systems  Constitutional: Negative.   HENT:  Positive for congestion.   Eyes: Negative.   Respiratory:  Positive for cough. Negative for chest tightness and shortness of breath.   Cardiovascular:  Negative for chest pain, palpitations and leg swelling.  Gastrointestinal:  Negative for abdominal distention, abdominal pain, constipation, diarrhea, nausea and vomiting.  Musculoskeletal: Negative.   Skin: Negative.   Neurological: Negative.   Psychiatric/Behavioral: Negative.      Objective:  Physical Exam Constitutional:      Appearance: She is well-developed.  HENT:     Head: Normocephalic and atraumatic.  Cardiovascular:     Rate and Rhythm: Normal rate and regular rhythm.  Pulmonary:     Effort: Pulmonary effort is normal. No respiratory distress.     Breath sounds: Normal breath sounds. No wheezing or rales.  Abdominal:     General: Bowel sounds are normal. There is no distension.     Palpations: Abdomen is soft.     Tenderness: There is no abdominal tenderness. There is no rebound.  Musculoskeletal:     Cervical back: Normal range of motion.  Skin:    General: Skin is warm and dry.  Neurological:     Mental Status: She is alert and oriented to person, place, and time.     Coordination: Coordination normal.     Vitals:   04/07/22 1047  BP: (!) 140/80  Pulse: 83  Temp: 98.6 F (37 C)  TempSrc: Oral  SpO2: 97%  Weight: 119 lb (54 kg)  Height: 5' 6.2" (1.681 m)    Assessment & Plan:  Visit time 15 minutes in face to face communication with patient and coordination of care, additional 5 minutes spent in record review, coordination or  care, ordering tests, communicating/referring to other healthcare professionals, documenting in medical records all on the same day of the visit for total time 20 minutes spent on the visit.

## 2022-04-12 ENCOUNTER — Ambulatory Visit (INDEPENDENT_AMBULATORY_CARE_PROVIDER_SITE_OTHER): Payer: Medicare Other | Admitting: Internal Medicine

## 2022-04-12 ENCOUNTER — Encounter: Payer: Self-pay | Admitting: Internal Medicine

## 2022-04-12 VITALS — BP 130/76 | HR 88 | Ht 66.25 in | Wt 118.1 lb

## 2022-04-12 DIAGNOSIS — K642 Third degree hemorrhoids: Secondary | ICD-10-CM

## 2022-04-12 NOTE — Progress Notes (Signed)
Patricia Dillon is a 72 year old female with a history of symptomatic bleeding and prolapsing hemorrhoids who is seen to discuss hemorrhoidal banding.  No prior treatment.  Bowel movements are regular but she has frequent rectal bleeding and prolapse post bowel movement.  She had colonoscopy on 03/02/2022 which showed hemorrhoids, moderately sized internal hemorrhoids.  Prolapsed tissue in the right posterior position which by biopsy was consistent with prolapse change.  Symptoms prior to banding: Bleeding and prolapse   PROCEDURE NOTE:  The patient presents with symptomatic grade 3 internal hemorrhoids, requesting rubber band ligation of her hemorrhoidal disease.  All risks, benefits and alternative forms of therapy were described and informed consent was obtained.   The anorectum was pre-medicated with 0.125% nitroglycerin ointment The decision was made to band the RP internal hemorrhoid, and the [facility] System was used to perform band ligation without complication.   Digital anorectal examination was then performed to assure proper positioning of the band, and to adjust the banded tissue as required.  The patient was discharged home without pain or other issues.  Dietary and behavioral recommendations were given and along with follow-up instructions.     The patient will return as scheduled for follow-up and possible additional banding as required. No complications were encountered and the patient tolerated the procedure well.

## 2022-04-12 NOTE — Patient Instructions (Addendum)
_______________________________________________________  If your blood pressure at your visit was 140/90 or greater, please contact your primary care physician to follow up on this. _______________________________________________________  If you are age 71 or older, your body mass index should be between 23-30. Your Body mass index is 18.92 kg/m. If this is out of the aforementioned range listed, please consider follow up with your Primary Care Provider. ________________________________________________________  The [facility] providers would like to encourage you to use [facility] to communicate with providers for non-urgent requests or questions.  Due to long hold times on the telephone, sending your provider a message by [facility] may be a faster and more efficient way to get a response.  Please allow 48 business hours for a response.  Please remember that this is for non-urgent requests.  _______________________________________________________  [place] PROCEDURE    FOLLOW-UP CARE   The procedure you have had should have been relatively painless since the banding of the area involved does not have nerve endings and there is no pain sensation.  The rubber band cuts off the blood supply to the hemorrhoid and the band may fall off as soon as 48 hours after the banding (the band may occasionally be seen in the toilet bowl following a bowel movement). You may notice a temporary feeling of fullness in the rectum which should respond adequately to plain Tylenol or Motrin.  Following the banding, avoid strenuous exercise that evening and resume full activity the next day.  A sitz bath (soaking in a warm tub) or bidet is soothing, and can be useful for cleansing the area after bowel movements.     To avoid constipation, take two tablespoons of natural wheat bran, natural oat bran, flax, Benefiber or any over the counter fiber supplement and increase your water intake to 7-8 glasses daily.     Unless you have been prescribed anorectal medication, do not put anything inside your rectum for two weeks: No suppositories, enemas, fingers, etc.  Occasionally, you may have more bleeding than usual after the banding procedure.  This is often from the untreated hemorrhoids rather than the treated one.  Don't be concerned if there is a tablespoon or so of blood.  If there is more blood than this, lie flat with your bottom higher than your head and apply an ice pack to the area. If the bleeding does not stop within a half an hour or if you feel faint, call our office at [phone] or go to the emergency room.  Problems are not common; however, if there is a substantial amount of bleeding, severe pain, chills, fever or difficulty passing urine (very rare) or other problems, you should call us at [phone] or report to the nearest emergency room.  Do not stay seated continuously for more than 2-3 hours for a day or two after the procedure.  Tighten your buttock muscles 10-15 times every two hours and take 10-15 deep breaths every 1-2 hours.  Do not spend more than a few minutes on the toilet if you cannot empty your bowel; instead re-visit the toilet at a later time.  You are scheduled to follow up on 05-23-22 at 11:20am   Thank you for entrusting me with your care and choosing [facility].  Dr Rhea Belton

## 2022-04-13 ENCOUNTER — Telehealth: Payer: Self-pay | Admitting: Internal Medicine

## 2022-04-13 NOTE — Telephone Encounter (Signed)
Patient was seen on the 5th of April for upper respiratory symptoms - patient states that she is still having all of the same symptoms - can something else be sent in?  Please call patient and let her know.  [phone]

## 2022-04-13 NOTE — Telephone Encounter (Signed)
Pt is still having a low grade fever and no energy. States that she will try OTC allergy medicine and if nothing helps will give [place] a call back. She did want to know why her fever is still ranging between 98 and 99 she states that is not normal for her.

## 2022-04-13 NOTE — Telephone Encounter (Signed)
Symptoms: Cough and Congestion

## 2022-04-13 NOTE — Telephone Encounter (Signed)
Is she taking any otc allergy medicine to help? I would recommend this first. Cough can take 3-4 weeks to resolve and allergies can be causing her congestion. For any new SOB let me know.

## 2022-04-14 NOTE — Telephone Encounter (Signed)
This is considered a normal temperature so I would not consider this fevers.

## 2022-04-21 ENCOUNTER — Ambulatory Visit (INDEPENDENT_AMBULATORY_CARE_PROVIDER_SITE_OTHER): Payer: Medicare Other | Admitting: Family Medicine

## 2022-04-21 ENCOUNTER — Encounter: Payer: Self-pay | Admitting: Family Medicine

## 2022-04-21 ENCOUNTER — Ambulatory Visit (INDEPENDENT_AMBULATORY_CARE_PROVIDER_SITE_OTHER): Payer: Medicare Other

## 2022-04-21 VITALS — BP 130/84 | HR 72 | Temp 97.8°F | Ht 66.25 in | Wt 118.0 lb

## 2022-04-21 DIAGNOSIS — R5383 Other fatigue: Secondary | ICD-10-CM | POA: Diagnosis not present

## 2022-04-21 DIAGNOSIS — R059 Cough, unspecified: Secondary | ICD-10-CM | POA: Diagnosis not present

## 2022-04-21 DIAGNOSIS — J449 Chronic obstructive pulmonary disease, unspecified: Secondary | ICD-10-CM | POA: Diagnosis not present

## 2022-04-21 DIAGNOSIS — J439 Emphysema, unspecified: Secondary | ICD-10-CM | POA: Diagnosis not present

## 2022-04-21 DIAGNOSIS — R058 Other specified cough: Secondary | ICD-10-CM | POA: Diagnosis not present

## 2022-04-21 DIAGNOSIS — R0982 Postnasal drip: Secondary | ICD-10-CM | POA: Diagnosis not present

## 2022-04-21 LAB — CBC WITH DIFFERENTIAL/PLATELET
Basophils Absolute: 0.1 10*3/uL (ref 0.0–0.1)
Basophils Relative: 1.1 % (ref 0.0–3.0)
Eosinophils Absolute: 0.1 10*3/uL (ref 0.0–0.7)
Eosinophils Relative: 2.3 % (ref 0.0–5.0)
HCT: 37.2 % (ref 36.0–46.0)
Hemoglobin: 12.6 g/dL (ref 12.0–15.0)
Lymphocytes Relative: 16 % (ref 12.0–46.0)
Lymphs Abs: 1 10*3/uL (ref 0.7–4.0)
MCHC: 33.8 g/dL (ref 30.0–36.0)
MCV: 90.9 fl (ref 78.0–100.0)
Monocytes Absolute: 0.4 10*3/uL (ref 0.1–1.0)
Monocytes Relative: 6.8 % (ref 3.0–12.0)
Neutro Abs: 4.7 10*3/uL (ref 1.4–7.7)
Neutrophils Relative %: 73.8 % (ref 43.0–77.0)
Platelets: 227 10*3/uL (ref 150.0–400.0)
RBC: 4.09 Mil/uL (ref 3.87–5.11)
RDW: 13.6 % (ref 11.5–15.5)
WBC: 6.4 10*3/uL (ref 4.0–10.5)

## 2022-04-21 LAB — BASIC METABOLIC PANEL
BUN: 18 mg/dL (ref 6–23)
CO2: 32 mEq/L (ref 19–32)
Calcium: 9.4 mg/dL (ref 8.4–10.5)
Chloride: 101 mEq/L (ref 96–112)
Creatinine, Ser: 0.9 mg/dL (ref 0.40–1.20)
GFR: 64.82 mL/min (ref >60.00)
Glucose, Bld: 93 mg/dL (ref 70–99)
Potassium: 4.3 mEq/L (ref 3.5–5.1)
Sodium: 140 mEq/L (ref 135–145)

## 2022-04-21 MED ORDER — AZITHROMYCIN 250 MG PO TABS: ORAL_TABLET | ORAL | 0 refills | Status: DC

## 2022-04-21 NOTE — Progress Notes (Signed)
Subjective:  Patricia Dillon is a 72 y.o. female who presents for fatigue, facial pain, pain behind left eye, cough and congestion. Mild dizziness in the mornings. No energy to exercise. Mild nausea.   Low grade fevers up to 101.5.  Denies chills, body aches, rhinorrhea, ST, palpitations, shortness of breath, abdominal pain, V/D.   She is not currently taking anything or her symptoms.   No other aggravating or relieving factors.  No other c/o.  ROS as in subjective.   Objective: Vitals:   04/21/22 0957  BP: 130/84  Pulse: 72  Temp: 97.8 F (36.6 C)  SpO2: 100%    General appearance: Alert, WD/WN, no distress, mildly ill appearing                             Skin: warm, no rash                           Head: no sinus tenderness                            Eyes: conjunctiva normal, corneas clear, PERRLA                            Ears: pearly TMs, external ear canals normal                          Nose: septum midline, no drainage             Mouth/throat: MMM, tongue normal, mild pharyngeal erythema                           Neck: supple, no adenopathy, no thyromegaly, nontender                          Heart: RRR                         Lungs: CTA bilaterally, no wheezes, rales, or rhonchi      Assessment: Cough present for greater than 3 weeks - Plan: azithromycin (ZITHROMAX) 250 MG tablet, DG Chest 2 View  Post-nasal drainage  Fatigue, unspecified type - Plan: DG Chest 2 View, CBC with Differential/Platelet, Basic metabolic panel, Basic metabolic panel, CBC with Differential/Platelet   Plan: CXR, CBC, BMP ordered. Suspect bacterial infection at this point. Z-pak prescribed. Declines Tessalon.  Suggested symptomatic OTC remedies.   Tylenol or Ibuprofen OTC prn.  Call/return if not improving in the next 4-5 days.

## 2022-04-21 NOTE — Patient Instructions (Signed)
Please go downstairs for labs and a chest x-ray before you leave.  Take the antibiotic as prescribed.  Try over-the-counter Mucinex or Mucinex DM and drink plenty of water.  You may take Tylenol or ibuprofen as needed for headache and sore throat.

## 2022-04-24 ENCOUNTER — Encounter: Payer: Self-pay | Admitting: Family Medicine

## 2022-04-24 ENCOUNTER — Other Ambulatory Visit: Payer: Self-pay | Admitting: Family Medicine

## 2022-04-24 DIAGNOSIS — J189 Pneumonia, unspecified organism: Secondary | ICD-10-CM

## 2022-04-24 MED ORDER — DOXYCYCLINE HYCLATE 100 MG PO TABS: 100.0000 mg | ORAL_TABLET | Freq: Two times a day (BID) | ORAL | 0 refills | Status: DC

## 2022-04-24 NOTE — Progress Notes (Signed)
Please reach out to her. Her chest X ray shows pneumonia on the right with COPD changes vs right lower lobe nodule. I sent in a different antibiotic called Doxycycline. She should start this right away and take it with food. Needs f/u in office with Dr. Okey Dupre in 2-3 weeks to discuss X ray and repeat imaging.

## 2022-04-24 NOTE — Telephone Encounter (Signed)
Pt had allergic reaction to azithromycin and has stopped taking it due to this. She is wondering if she needs another abx to help with cough or what you recommend? Advised to go ahead and start on mucinex as you recommended.

## 2022-05-02 DIAGNOSIS — N958 Other specified menopausal and perimenopausal disorders: Secondary | ICD-10-CM | POA: Diagnosis not present

## 2022-05-02 DIAGNOSIS — Z1151 Encounter for screening for human papillomavirus (HPV): Secondary | ICD-10-CM | POA: Diagnosis not present

## 2022-05-02 DIAGNOSIS — Z124 Encounter for screening for malignant neoplasm of cervix: Secondary | ICD-10-CM | POA: Diagnosis not present

## 2022-05-02 DIAGNOSIS — Z681 Body mass index (BMI) 19 or less, adult: Secondary | ICD-10-CM | POA: Diagnosis not present

## 2022-05-02 DIAGNOSIS — L900 Lichen sclerosus et atrophicus: Secondary | ICD-10-CM | POA: Diagnosis not present

## 2022-05-03 NOTE — Telephone Encounter (Signed)
Pt wondering if upper back pain can be a symptom of her pneumonia?

## 2022-05-09 ENCOUNTER — Ambulatory Visit (INDEPENDENT_AMBULATORY_CARE_PROVIDER_SITE_OTHER): Payer: Medicare Other | Admitting: Internal Medicine

## 2022-05-09 ENCOUNTER — Encounter: Payer: Self-pay | Admitting: Internal Medicine

## 2022-05-09 VITALS — BP 130/78 | HR 63 | Ht 66.25 in | Wt 119.2 lb

## 2022-05-09 DIAGNOSIS — E041 Nontoxic single thyroid nodule: Secondary | ICD-10-CM | POA: Diagnosis not present

## 2022-05-09 NOTE — Patient Instructions (Signed)
Please return 1 year.

## 2022-05-09 NOTE — Progress Notes (Signed)
Patient ID: HILTON ALGIERE, female   DOB: January 01, 1951, 72 y.o.   MRN: [id number]  HPI  JOZELYNN HILLERY is a 72 y.o.-year-old female, initially referred by her PCP, Dr. Dorise Hiss, for management of thyroid nodule. She moved from [place] in 10/2013. Last visit 1 year ago.  Interim history: She continues to have sore throat due to post nasal drip related to deviated septum and allergies.  Also had enlarged lymph nodes in neck (this is recurrent for her). She had a URI 6 weeks ago >> developed PNA last month >> was on Doxycycline. No chest pain or shortness of breath now. She was exercising prior to the pneumonia episode, but not since - plans to restart.  Reviewed and addended history: Pt. has a history of a right thyroid nodule that was incidentally found on MRI of the neck in 2010  - after a fall.  It was followed by serial ultrasounds:  Reviewed the reports and images of her thyroid ultrasounds: 09/30/2008: 0.7 x 0.4 x 0.4 cm 05/07/2009: 0.56 x 0.36 x 0.47 cm 07/12/2010: 0.63 x 0.37 x 0.39 cm 10/27/2012: 0.72 x 0.55 x 0.71 cm - 2 hyperechoic areas that appear as microcalcifications. Thyroid gland was homogeneous on U/S.    05/10/2015: Thyroid ultrasound: Right thyroid lobe: 40 x 13 x 14 mm.  - 9 x 7 x 8 mm solid nodule with coarse calcification, inferior pole (previously 7 x 6 x 7).  - 3 mm hypoechoic nodule, superior pole. Left thyroid lobe: 42 x 9 x 11 mm.  - Two small colloid nodules less than 4 mm. Isthmus Thickness: 1.4 mm.  No nodules visualized. Lymphadenopathy: None visualized.   IMPRESSION: 1. Small bilateral nodules as above. Findings do not meet current consensus criteria for biopsy. Follow-up by clinical exam is recommended.    Reviewing the images, nodule appears isoechoic, and the "coarse calcification" appears to be a colloid granules with comet tail sign (benign finding).  Since the nodule appeared to be low risk for cancer, I did not suggest biopsy.  04/09/2019: Thyroid  ultrasound: Nodule # 1: Prior biopsy: No Location: Right; Inferior Maximum size: 1.2 cm; Other 2 dimensions: 0.9 x 0.9 cm, previously, 0.9 x 0.7 x 0.8 cm Composition: mixed cystic and solid (1) Echogenicity: isoechoic (1) Echogenic foci: punctate echogenic foci (3)  *Given size (>/= 1 - 1.4 cm) and appearance, a follow-up ultrasound in 1 year should be considered based on TI-RADS criteria.   IMPRESSION: 1. Normal-sized thyroid with solitary right nodule. Recommend annual/biennial ultrasound follow-up as above, until stability x5 years confirmed.    06/07/2020: Thyroid ultrasound: Nodule # 1:  Prior biopsy: No  Location: Right; inferior  Maximum size: 1.3 cm; Other 2 dimensions: 0.9 x 0.8 cm, previously, 1.2 x 0.9 x 0.9 cm  Composition: solid/almost completely solid (2)  Echogenicity: isoechoic (1) Echogenic foci: punctate echogenic foci (3) ACR TI-RADS total points: 6. *Given size (>/= 1 - 1.4 cm) and appearance, a follow-up ultrasound in 1 year should be considered based on TI-RADS criteria. _________________________________________________________   IMPRESSION: Solitary TI-RADS 4 right thyroid nodule is not significantly changed in size since prior exam.   Continued annual follow-up recommended until 5 year stability is confirmed.  06/27/2021: Thyroid ultrasound: Parenchymal Echotexture: Moderately heterogenous  Isthmus: 0.2 cm thickness, stable  Right lobe: 4.1 x 0.9 x 1.6 cm, previously 4 x 1.3 x 1.5  Left lobe: 4 x 1.1 x 1.1 cm, previously 4.5 x 0.9 x 1  _________________________________________________________   Estimated  total number of nodules >/= 1 cm: 1 _________________________________________________________   Nodule # 1:  Prior biopsy: No  Location: Right; inferior  Maximum size: 1.4 cm; Other 2 dimensions: 1.1 x 0.9 cm, previously, 1.3 x 0.9 x 0.8 cm  Composition: solid/almost completely solid (2)  Echogenicity: hypoechoic (2) *Given size (>/= 1 - 1.4  cm) and appearance, a follow-up ultrasound in 1 year should be considered based on TI-RADS criteria.  _________________________________________________________ No new thyroid nodule.   No regional cervical adenopathy.   IMPRESSION: 1. Stable solitary inferior right nodule. Recommend annual/biennial ultrasound follow-up, until stability x5 years confirmed.  Reviewed patient's TFTs: Lab Results  Component Value Date   TSH 1.82 06/13/2021   TSH 2.17 05/07/2020   TSH 2.12 04/30/2019   TSH 2.900 07/11/2016   TSH 1.18 06/24/2015   TSH 1.300 04/09/2014   FREET4 0.82 06/13/2021   FREET4 0.63 05/07/2020   FREET4 1.13 07/11/2016  02/22/2017 (Dr. Zelphia Cairo): TSH 2.90 08/16/2012: TSH 2.480, fT4 1.23   Pt denies: - feeling nodules in neck - hoarseness - dysphagia - choking  Pt does not have a FH of thyroid ds. No FH of thyroid cancer. No h/o radiation tx to head or neck. No recent contrast studies. No herbal supplements. No Biotin use. On B12. No recent steroids use.   She also has a history of Mitral regurgitation.  She also has a history of osteopenia.  She is on vitamin D. She has lichen sclerosis >> she has clobetasol cream, but only uses it as needed.  ROS: + see HPI  I reviewed pt's medications, allergies, PMH, social hx, family hx, and changes were documented in the history of present illness. Otherwise, unchanged from my initial visit note.  Past Medical History:  Diagnosis Date   Diverticulosis    External hemorrhoids    Hyperlipidemia    Internal hemorrhoids    Lichen sclerosus    Mitral valve regurgitation    Osteopenia    SVT (supraventricular tachycardia)    Tubular adenoma of colon    Vitamin B12 deficiency    Past Surgical History:  Procedure Laterality Date   BREAST BIOPSY Left    benign   BREAST EXCISIONAL BIOPSY Left    COLONOSCOPY     2002, 2006, 2012 at [place]   History   Social History   Marital Status: Divorced    Spouse Name: N/A    Number of Children: 2   Occupational History   Conservation officer, [employer].   Social History Main Topics   Smoking status: Never Smoker    Smokeless tobacco: Not on file   Alcohol Use: No   Drug Use: No   Current Outpatient Medications on File Prior to Visit  Medication Sig Dispense Refill   betamethasone dipropionate (DIPROLENE) 0.05 % ointment Apply topically.     Cholecalciferol (VITAMIN D3) 50 MCG (2000 UT) capsule Take 2,000 Units by mouth daily.     clobetasol cream (TEMOVATE) 0.05 % Apply 1 application  topically as needed.     doxycycline (VIBRA-TABS) 100 MG tablet Take 1 tablet (100 mg total) by mouth 2 (two) times daily. 14 tablet 0   vitamin B-12 (CYANOCOBALAMIN) 500 MCG tablet Take 500 mcg by mouth daily. Brand: Megafood, includes Folate 340 mcg, vit B6 - 8 mg, includes food blend     No current facility-administered medications on file prior to visit.   Allergies  Allergen Reactions   Vancomycin Itching   Penicillins Rash   Azithromycin Hives and Itching  Family History  Problem Relation Age of Onset   Hypertension Mother    Heart disease Father    Melanoma Daughter        age 52   Colon polyps Daughter    Colon polyps Son    Colon cancer Neg Hx    Esophageal cancer Neg Hx    Rectal cancer Neg Hx    Ulcerative colitis Neg Hx    PE: BP 130/78 (BP Location: Right Arm, Patient Position: Sitting, Cuff Size: Normal)   Pulse 63   Ht 5' 6.25" (1.683 m)   Wt 119 lb 3.2 oz (54.1 kg)   SpO2 99%   BMI 19.09 kg/m   Wt Readings from Last 3 Encounters:  05/09/22 119 lb 3.2 oz (54.1 kg)  04/21/22 118 lb (53.5 kg)  04/12/22 118 lb 2 oz (53.6 kg)   Constitutional:normal weight, in NAD Eyes:  EOMI, no exophthalmos ENT: no neck masses, no cervical lymphadenopathy Cardiovascular: RRR, No MRG Respiratory: CTA B Musculoskeletal: no deformities Skin:no rashes Neurological: no tremor with outstretched hands  ASSESSMENT: 1. R Thyroid nodule  PLAN: 1.  Thyroid nodule -Patient  with a history of right thyroid nodule observed on the ultrasound from 2014, 17, 2021, 2022, 2023.  The nodule was very small on the initial ultrasound, without internal blood flow, more wide than tall, and without irregular margins, also, isoechoic/mildly 5 and with 2 colloid granules (rather than microcalcifications as initially characterized on the report) -benign finding, due to inspissated colloid.  The nodule was stable over 8 years between 2010 and 2017.  On the ultrasound from 2021, the nodule appears to have a larger colloid component and this was probably the reason why it appeared larger.  The recommendation was to repeat the ultrasound in a year.  She had another thyroid ultrasound in 06/2020 and this showed that the nodule was stable, measuring 1.3 x 0.9 x 0.8 cm, isoechoic, and without worrisome characteristics.  Due to the fact that it contained punctate hyperechogenic foci, recommendation was made to continue to follow it until 5 years of stability was noted.  Latest thyroid ultrasound was obtained after last visit, on 06/27/2021 and the nodule was again stable, requiring only follow-up in a year, but no other intervention. -Of note, she has no family history of thyroid cancer or history of radiation therapy to head or neck to increase her risk of thyroid cancer -At today's visit, she denies neck compression symptoms and no masses are felt on palpation of her neck.  At last visit she has postnasal drip and I recommended to stop drinking milk and switch to almond milk since intake of dairy has been associated with more postnasal drip and congestion.  She is drinking less milk now - started flax milk. -Latest TSH was normal, in 06/2021 -At today's visit, we discussed about skipping the ultrasound next year and repeating it in a year.  She agrees with this plan -I will see her in 1 year.  Carlus Pavlov, MD PhD [facility] Endocrinology

## 2022-05-15 DIAGNOSIS — H02055 Trichiasis without entropian left lower eyelid: Secondary | ICD-10-CM | POA: Diagnosis not present

## 2022-05-16 ENCOUNTER — Ambulatory Visit (INDEPENDENT_AMBULATORY_CARE_PROVIDER_SITE_OTHER): Payer: Medicare Other

## 2022-05-16 ENCOUNTER — Ambulatory Visit (INDEPENDENT_AMBULATORY_CARE_PROVIDER_SITE_OTHER): Payer: Medicare Other | Admitting: Internal Medicine

## 2022-05-16 ENCOUNTER — Encounter: Payer: Self-pay | Admitting: Internal Medicine

## 2022-05-16 VITALS — BP 138/80 | HR 61 | Temp 98.3°F | Ht 66.25 in | Wt 120.0 lb

## 2022-05-16 DIAGNOSIS — J189 Pneumonia, unspecified organism: Secondary | ICD-10-CM

## 2022-05-16 NOTE — Assessment & Plan Note (Signed)
Checking CXR today. She is clinically improving. We discussed she could have large torso causing the potential read of COPD. We discussed that this would have to be diagnosed on lung testing which she had without COPD 2020.

## 2022-05-16 NOTE — Progress Notes (Signed)
   Subjective:   Patient ID: Patricia Dillon, female    DOB: 12/23/1950, 72 y.o.   MRN: [id number]  HPI The patient is a 71 YO female coming in for x-ray follow up. Took course of antibiotics for pneumonia and is doing better but stamina is down. Starting back gradual at the silver sneakers.   Review of Systems  Constitutional:  Positive for fatigue.  HENT: Negative.    Eyes: Negative.   Respiratory:  Negative for cough, chest tightness and shortness of breath.   Cardiovascular:  Negative for chest pain, palpitations and leg swelling.  Gastrointestinal:  Negative for abdominal distention, abdominal pain, constipation, diarrhea, nausea and vomiting.  Musculoskeletal: Negative.   Skin: Negative.   Neurological: Negative.   Psychiatric/Behavioral: Negative.      Objective:  Physical Exam Constitutional:      Appearance: She is well-developed.  HENT:     Head: Normocephalic and atraumatic.  Cardiovascular:     Rate and Rhythm: Normal rate and regular rhythm.  Pulmonary:     Effort: Pulmonary effort is normal. No respiratory distress.     Breath sounds: Normal breath sounds. No wheezing or rales.  Abdominal:     General: Bowel sounds are normal. There is no distension.     Palpations: Abdomen is soft.     Tenderness: There is no abdominal tenderness. There is no rebound.  Musculoskeletal:     Cervical back: Normal range of motion.  Skin:    General: Skin is warm and dry.  Neurological:     Mental Status: She is alert and oriented to person, place, and time.     Coordination: Coordination normal.     Vitals:   05/16/22 0900  BP: 138/80  Pulse: 61  Temp: 98.3 F (36.8 C)  TempSrc: Oral  SpO2: 99%  Weight: 120 lb (54.4 kg)  Height: 5' 6.25" (1.683 m)    Assessment & Plan:

## 2022-05-23 ENCOUNTER — Encounter: Payer: Self-pay | Admitting: Internal Medicine

## 2022-05-23 ENCOUNTER — Ambulatory Visit (INDEPENDENT_AMBULATORY_CARE_PROVIDER_SITE_OTHER): Payer: Medicare Other | Admitting: Internal Medicine

## 2022-05-23 VITALS — BP 122/70 | HR 54 | Ht 66.5 in | Wt 120.6 lb

## 2022-05-23 DIAGNOSIS — K648 Other hemorrhoids: Secondary | ICD-10-CM

## 2022-05-23 NOTE — Progress Notes (Signed)
Patricia Dillon is a 72 year old female with a history of symptomatic bleeding and prolapsing hemorrhoids who is here for additional hemorrhoidal banding  Symptoms prior to banding: Frequent rectal bleeding and prolapse  After banding #1 to the right posterior hemorrhoid she has had a lot less bleeding and less prolapse.  She is happy with the response   PROCEDURE NOTE:  The patient presents with symptomatic grade 3 internal hemorrhoids, requesting rubber band ligation of her hemorrhoidal disease.  All risks, benefits and alternative forms of therapy were described and informed consent was obtained.   The anorectum was pre-medicated with 0.125% nitroglycerin ointment The decision was made to band the LL (RP banded at visit #1) internal hemorrhoid, and the [facility] O'Regan System was used to perform band ligation without complication.   Digital anorectal examination was then performed to assure proper positioning of the band, and to adjust the banded tissue as required.  The patient was discharged home without pain or other issues.  Dietary and behavioral recommendations were given and along with follow-up instructions.     The following adjunctive treatments were recommended: --She can try to reintroduce Metamucil or Benefiber 2 teaspoons working to 2 tablespoons daily to help regulate bowel movements  The patient will return as scheduled for follow-up and possible additional banding as required. No complications were encountered and the patient tolerated the procedure well.

## 2022-05-23 NOTE — Patient Instructions (Addendum)
You have been scheduled for your 3rd hemorrhoidal banding on 08/28/22 at 3:40 pm.  HEMORRHOID BANDING PROCEDURE    FOLLOW-UP CARE   The procedure you have had should have been relatively painless since the banding of the area involved does not have nerve endings and there is no pain sensation.  The rubber band cuts off the blood supply to the hemorrhoid and the band may fall off as soon as 48 hours after the banding (the band may occasionally be seen in the toilet bowl following a bowel movement). You may notice a temporary feeling of fullness in the rectum which should respond adequately to plain Tylenol or Motrin.  Following the banding, avoid strenuous exercise that evening and resume full activity the next day.  A sitz bath (soaking in a warm tub) or bidet is soothing, and can be useful for cleansing the area after bowel movements.     To avoid constipation, take two tablespoons of natural wheat bran, natural oat bran, flax, Benefiber or any over the counter fiber supplement and increase your water intake to 7-8 glasses daily.    Unless you have been prescribed anorectal medication, do not put anything inside your rectum for two weeks: No suppositories, enemas, fingers, etc.  Occasionally, you may have more bleeding than usual after the banding procedure.  This is often from the untreated hemorrhoids rather than the treated one.  Don't be concerned if there is a tablespoon or so of blood.  If there is more blood than this, lie flat with your bottom higher than your head and apply an ice pack to the area. If the bleeding does not stop within a half an hour or if you feel faint, call our office at [phone] or go to the emergency room.  Problems are not common; however, if there is a substantial amount of bleeding, severe pain, chills, fever or difficulty passing urine (very rare) or other problems, you should call us at [phone] or report to the nearest emergency room.  Do not  stay seated continuously for more than 2-3 hours for a day or two after the procedure.  Tighten your buttock muscles 10-15 times every two hours and take 10-15 deep breaths every 1-2 hours.  Do not spend more than a few minutes on the toilet if you cannot empty your bowel; instead re-visit the toilet at a later time.   _______________________________________________________  If your blood pressure at your visit was 140/90 or greater, please contact your primary care physician to follow up on this.  _______________________________________________________  If you are age 40 or older, your body mass index should be between 23-30. Your Body mass index is 19.17 kg/m. If this is out of the aforementioned range listed, please consider follow up with your Primary Care Provider.  If you are age 23 or younger, your body mass index should be between 19-25. Your Body mass index is 19.17 kg/m. If this is out of the aformentioned range listed, please consider follow up with your Primary Care Provider.   ________________________________________________________  The [place] [facility] providers would like to encourage you to use [facility] to communicate with providers for non-urgent requests or questions.  Due to long hold times on the telephone, sending your provider a message by [facility] may be a faster and more efficient way to get a response.  Please allow 48 business hours for a response.  Please remember that this is for non-urgent requests.  _______________________________________________________  Due to recent changes in  healthcare laws, you may see the results of your imaging and laboratory studies on MyChart before your provider has had a chance to review them.  We understand that in some cases there may be results that are confusing or concerning to you. Not all laboratory results come back in the same time frame and the provider may be waiting for multiple results in order to interpret others.  Please give [place]  48 hours in order for your provider to thoroughly review all the results before contacting the office for clarification of your results.

## 2022-05-25 NOTE — Patient Instructions (Signed)

## 2022-05-25 NOTE — Progress Notes (Signed)
I connected with  Patricia Dillon on 05/26/2022 by a audio enabled telemedicine application and verified that I am speaking with the correct person using two identifiers.  Patient Location: Home  Provider Location: Home Office  I discussed the limitations of evaluation and management by telemedicine. The patient expressed understanding and agreed to proceed.   Subjective:   Patricia Dillon is a 72 y.o. female who presents for Medicare Annual (Subsequent) preventive examination.  Review of Systems  Per HPI unless specifically indicated below.    Cardiac Risk Factors include: advanced age (>66men, >68 women);female gender, and Hyperlipidemia.          Objective:       05/23/2022   11:16 AM 05/16/2022    9:00 AM 05/09/2022    9:55 AM  Vitals with BMI  Height 5' 6.5" 5' 6.25" 5' 6.25"  Weight 120 lbs 10 oz 120 lbs 119 lbs 3 oz  BMI 19.18 19.22 19.09  Systolic 122 138 161  Diastolic 70 80 78  Pulse 54 61 63    There were no vitals filed for this visit. There is no height or weight on file to calculate BMI.     05/26/2022    9:39 AM 05/17/2021    9:41 AM 03/22/2018    9:01 PM  Advanced Directives  Does Patient Have a Medical Advance Directive? Yes Yes No  Type of Estate agent of [place];Living will Healthcare Power of Attorney   Does patient want to make changes to medical advance directive? No - Patient declined    Copy of Healthcare Power of Attorney in Chart? No - copy requested No - copy requested   Would patient like information on creating a medical advance directive?   No - Patient declined    Current Medications (verified) Outpatient Encounter Medications as of 05/26/2022  Medication Sig   betamethasone dipropionate (DIPROLENE) 0.05 % ointment Apply topically.   Cholecalciferol (VITAMIN D3) 50 MCG (2000 UT) capsule Take 2,000 Units by mouth daily.   clobetasol cream (TEMOVATE) 0.05 % Apply 1 application  topically as needed.   vitamin B-12  (CYANOCOBALAMIN) 500 MCG tablet Take 500 mcg by mouth daily. Brand: [employer], includes Folate 340 mcg, vit B6 - 8 mg, includes food blend   No facility-administered encounter medications on file as of 05/26/2022.    Allergies (verified) Vancomycin, Penicillins, and Azithromycin   History: Past Medical History:  Diagnosis Date   Diverticulosis    External hemorrhoids    Hyperlipidemia    Internal hemorrhoids    Lichen sclerosus    Mitral valve regurgitation    Osteopenia    SVT (supraventricular tachycardia)    Tubular adenoma of colon    Vitamin B12 deficiency    Past Surgical History:  Procedure Laterality Date   BREAST BIOPSY Left    benign   BREAST EXCISIONAL BIOPSY Left    COLONOSCOPY     2002, 2006, 2012 at [place]   Family History  Problem Relation Age of Onset   Hypertension Mother    Heart disease Father    Melanoma Daughter        age 63   Colon polyps Daughter    Colon polyps Son    Colon cancer Neg Hx    Esophageal cancer Neg Hx    Rectal cancer Neg Hx    Ulcerative colitis Neg Hx    Social History   Socioeconomic History   Marital status: Divorced  Spouse name: Not on file   Number of children: 2   Years of education: Not on file   Highest education level: Bachelor's degree (e.g., BA, AB, BS)  Occupational History   Occupation: retired  Tobacco Use   Smoking status: Never   Smokeless tobacco: Never  Vaping Use   Vaping Use: Never used  Substance and Sexual Activity   Alcohol use: No    Alcohol/week: 0.0 standard drinks of alcohol   Drug use: No   Sexual activity: Not on file  Other Topics Concern   Not on file  Social History Narrative   Not on file   Social Determinants of Health   Financial Resource Strain: Low Risk  (05/12/2022)   Overall Financial Resource Strain (CARDIA)    Difficulty of Paying Living Expenses: Not hard at all  Food Insecurity: No Food Insecurity (05/12/2022)   Hunger Vital Sign    Worried About Running Out  of Food in the Last Year: Never true    Ran Out of Food in the Last Year: Never true  Transportation Needs: No Transportation Needs (05/12/2022)   PRAPARE - Administrator, Civil Service (Medical): No    Lack of Transportation (Non-Medical): No  Physical Activity: Insufficiently Active (05/26/2022)   Exercise Vital Sign    Days of Exercise per Week: 2 days    Minutes of Exercise per Session: 60 min  Stress: No Stress Concern Present (05/12/2022)   [employer] of Occupational Health - Occupational Stress Questionnaire    Feeling of Stress : Only a little  Social Connections: Socially Isolated (05/12/2022)   Social Connection and Isolation Panel [NHANES]    Frequency of Communication with Friends and Family: More than three times a week    Frequency of Social Gatherings with Friends and Family: More than three times a week    Attends Religious Services: Never    Database administrator or Organizations: No    Attends Engineer, structural: Not on file    Marital Status: Divorced    Tobacco Counseling Counseling given: No   Clinical Intake:  Pre-visit preparation completed: No  Pain : No/denies pain     Nutritional Status: BMI of 19-24  Normal Nutritional Risks: None Diabetes: No  How often do you need to have someone help you when you read instructions, pamphlets, or other written materials from your doctor or pharmacy?: 1 - Never  Diabetic?No  Interpreter Needed?: No  Information entered by :: Laurel Dimmer, CMA   Activities of Daily Living    05/24/2022    8:16 AM  In your present state of health, do you have any difficulty performing the following activities:  Hearing? 0  Vision? 0  Difficulty concentrating or making decisions? 0  Walking or climbing stairs? 0  Dressing or bathing? 0  Doing errands, shopping? 0  Preparing Food and eating ? N  Using the Toilet? N  In the past six months, have you accidently leaked urine? N  Do you have  problems with loss of bowel control? N  Managing your Medications? N  Managing your Finances? N  Housekeeping or managing your Housekeeping? N    Patient Care Team: Myrlene Broker, MD as PCP - General (Internal Medicine)  Indicate any recent Medical Services you may have received from other than [facility] providers in the past year (date may be approximate).     Assessment:   This is a routine wellness examination for Marlene.  Hearing/Vision screen Denies any hearing issues. Denies any change to her vision. Wear glasses. Annual Eye Exam.   Dietary issues and exercise activities discussed:     Goals Addressed   None    Depression Screen    05/26/2022    9:33 AM 05/16/2022    9:03 AM 04/07/2022   10:50 AM 06/13/2021    9:46 AM 05/17/2021    9:55 AM 04/25/2021    9:34 AM 03/03/2020    9:51 AM  PHQ 2/9 Scores  PHQ - 2 Score 0 0 0 0 0 0 0  PHQ- 9 Score 0 0 0 0       Fall Risk    05/24/2022    8:16 AM 05/16/2022    9:03 AM 04/07/2022   10:50 AM 06/13/2021    9:46 AM 05/17/2021    9:42 AM  Fall Risk   Falls in the past year? 0 0 0 0 0  Number falls in past yr:  0 0 0 0  Injury with Fall?  0 0 0 0  Follow up  Falls evaluation completed Falls evaluation completed  Falls evaluation completed;Education provided;Falls prevention discussed    FALL RISK PREVENTION PERTAINING TO THE HOME:  Any stairs in or around the home? No  If so, are there any without handrails? No  Home free of loose throw rugs in walkways, pet beds, electrical cords, etc? Yes  Adequate lighting in your home to reduce risk of falls? Yes   ASSISTIVE DEVICES UTILIZED TO PREVENT FALLS:  Life alert? No  Use of a cane, walker or w/c? No  Grab bars in the bathroom? No  Shower chair or bench in shower? No  Elevated toilet seat or a handicapped toilet? No   TIMED UP AND GO:  Was the test performed?Unable to perform, virtual appointment   Cognitive Function:        05/26/2022    9:35 AM 05/17/2021     9:43 AM  6CIT Screen  What Year? 0 points 0 points  What month? 0 points 0 points  What time? 0 points 0 points  Count back from 20 0 points 0 points  Months in reverse 0 points 0 points  Repeat phrase 2 points 0 points  Total Score 2 points 0 points    Immunizations Immunization History  Administered Date(s) Administered   Influenza Split 10/23/2019   Influenza,inj,Quad PF,6+ Mos 11/30/2014, 11/09/2016, 10/12/2017, 09/05/2018   Influenza-Unspecified 10/19/2020, 09/22/2021   PFIZER(Purple Top)SARS-COV-2 Vaccination 01/21/2019, 02/11/2019, 10/08/2019   Tdap 01/03/2000, 11/05/2014   Unspecified SARS-COV-2 Vaccination 01/21/2019, 02/11/2019, 10/08/2019, 09/24/2020, 10/13/2021    TDAP status: Up to date  Flu Vaccine status: Up to date  Pneumococcal vaccine status: Due, Education has been provided regarding the importance of this vaccine. Advised may receive this vaccine at local pharmacy or Health Dept. Aware to provide a copy of the vaccination record if obtained from local pharmacy or Health Dept. Verbalized acceptance and understanding.  Covid-19 vaccine status: Information provided on how to obtain vaccines.   Qualifies for Shingles Vaccine? Yes   Zostavax completed No   Shingrix Completed?: No.    Education has been provided regarding the importance of this vaccine. Patient has been advised to call insurance company to determine out of pocket expense if they have not yet received this vaccine. Advised may also receive vaccine at local pharmacy or Health Dept. Verbalized acceptance and understanding.  Screening Tests Health Maintenance  Topic Date Due   Hepatitis C Screening  Never done   Zoster Vaccines- Shingrix (1 of 2) Never done   Pneumonia Vaccine 62+ Years old (1 of 1 - PCV) Never done   COVID-19 Vaccine (9 - 2023-24 season) 06/01/2022 (Originally 12/08/2021)   INFLUENZA VACCINE  08/03/2022   Medicare Annual Wellness (AWV)  05/26/2023   MAMMOGRAM  08/20/2023    DTaP/Tdap/Td (3 - Td or Tdap) 11/04/2024   Colonoscopy  03/02/2027   DEXA SCAN  Completed   HPV VACCINES  Aged Out    Health Maintenance  Health Maintenance Due  Topic Date Due   Hepatitis C Screening  Never done   Zoster Vaccines- Shingrix (1 of 2) Never done   Pneumonia Vaccine 4+ Years old (1 of 1 - PCV) Never done    Colorectal cancer screening: Type of screening: Colonoscopy. Completed 03/02/2022. Repeat every 5 years  Mammogram status: Completed 08/19/2021. Repeat every year  DEXA Scan: 06/06/2021  Lung Cancer Screening: (Low Dose CT Chest recommended if Age 58-80 years, 30 pack-year currently smoking OR have quit w/in 15years.) does not qualify.   Lung Cancer Screening Referral: not applicable   Additional Screening:  Hepatitis C Screening: does not qualify;  Vision Screening: Recommended annual ophthalmology exams for early detection of glaucoma and other disorders of the eye. Is the patient up to date with their annual eye exam?  Yes Who is the provider or what is the name of the office in which the patient attends annual eye exams? [facility]  If pt is not established with a provider, would they like to be referred to a provider to establish care? No .   Dental Screening: Recommended annual dental exams for proper oral hygiene  Community Resource Referral / Chronic Care Management: CRR required this visit?  No   CCM required this visit?  No      Plan:     I have personally reviewed and noted the following in the patient's chart:   Medical and social history Use of alcohol, tobacco or illicit drugs  Current medications and supplements including opioid prescriptions. Patient is not currently taking opioid prescriptions. Functional ability and status Nutritional status Physical activity Advanced directives List of other physicians Hospitalizations, surgeries, and ER visits in previous 12 months Vitals Screenings to include cognitive,  depression, and falls Referrals and appointments  In addition, I have reviewed and discussed with patient certain preventive protocols, quality metrics, and best practice recommendations. A written personalized care plan for preventive services as well as general preventive health recommendations were provided to patient.    Ms. Argenbright , Thank you for taking time to come for your Medicare Wellness Visit. I appreciate your ongoing commitment to your health goals. Please review the following plan we discussed and let me know if I can assist you in the future.   These are the goals we discussed:  Goals   None     This is a list of the screening recommended for you and due dates:  Health Maintenance  Topic Date Due   Hepatitis C Screening  Never done   Zoster (Shingles) Vaccine (1 of 2) Never done   Pneumonia Vaccine (1 of 1 - PCV) Never done   COVID-19 Vaccine (9 - 2023-24 season) 06/01/2022*   Flu Shot  08/03/2022   Medicare Annual Wellness Visit  05/26/2023   Mammogram  08/20/2023   DTaP/Tdap/Td vaccine (3 - Td or Tdap) 11/04/2024   Colon Cancer Screening  03/02/2027   DEXA scan (bone density measurement)  Completed   HPV Vaccine  Aged Out  *Topic was postponed. The date shown is not the original due date.    Ms. Provance , Thank you for taking time to come for your Medicare Wellness Visit. I appreciate your ongoing commitment to your health goals. Please review the following plan we discussed and let me know if I can assist you in the future.   These are the goals we discussed:  Goals   None     This is a list of the screening recommended for you and due dates:  Health Maintenance  Topic Date Due   Hepatitis C Screening  Never done   Zoster (Shingles) Vaccine (1 of 2) Never done   Pneumonia Vaccine (1 of 1 - PCV) Never done   COVID-19 Vaccine (9 - 2023-24 season) 06/01/2022*   Flu Shot  08/03/2022   Medicare Annual Wellness Visit  05/26/2023   Mammogram  08/20/2023    DTaP/Tdap/Td vaccine (3 - Td or Tdap) 11/04/2024   Colon Cancer Screening  03/02/2027   DEXA scan (bone density measurement)  Completed   HPV Vaccine  Aged Out  *Topic was postponed. The date shown is not the original due date.     Lonna Cobb, CMA   05/26/2022  Nurse Notes: Approximately 30 minute Non-Face -To-Face Medicare Wellness Visit

## 2022-05-26 ENCOUNTER — Ambulatory Visit (INDEPENDENT_AMBULATORY_CARE_PROVIDER_SITE_OTHER): Payer: Medicare Other

## 2022-05-26 DIAGNOSIS — Z0000 Encounter for general adult medical examination without abnormal findings: Secondary | ICD-10-CM | POA: Diagnosis not present

## 2022-06-13 DIAGNOSIS — D1801 Hemangioma of skin and subcutaneous tissue: Secondary | ICD-10-CM | POA: Diagnosis not present

## 2022-06-13 DIAGNOSIS — L900 Lichen sclerosus et atrophicus: Secondary | ICD-10-CM | POA: Diagnosis not present

## 2022-06-13 DIAGNOSIS — L438 Other lichen planus: Secondary | ICD-10-CM | POA: Diagnosis not present

## 2022-06-13 DIAGNOSIS — L820 Inflamed seborrheic keratosis: Secondary | ICD-10-CM | POA: Diagnosis not present

## 2022-06-13 DIAGNOSIS — L821 Other seborrheic keratosis: Secondary | ICD-10-CM | POA: Diagnosis not present

## 2022-06-13 DIAGNOSIS — D225 Melanocytic nevi of trunk: Secondary | ICD-10-CM | POA: Diagnosis not present

## 2022-06-13 DIAGNOSIS — D2271 Melanocytic nevi of right lower limb, including hip: Secondary | ICD-10-CM | POA: Diagnosis not present

## 2022-06-13 DIAGNOSIS — L918 Other hypertrophic disorders of the skin: Secondary | ICD-10-CM | POA: Diagnosis not present

## 2022-06-13 DIAGNOSIS — L570 Actinic keratosis: Secondary | ICD-10-CM | POA: Diagnosis not present

## 2022-07-11 ENCOUNTER — Other Ambulatory Visit: Payer: Self-pay | Admitting: Obstetrics and Gynecology

## 2022-07-11 DIAGNOSIS — Z1231 Encounter for screening mammogram for malignant neoplasm of breast: Secondary | ICD-10-CM

## 2022-08-14 DIAGNOSIS — L298 Other pruritus: Secondary | ICD-10-CM | POA: Diagnosis not present

## 2022-08-14 DIAGNOSIS — L821 Other seborrheic keratosis: Secondary | ICD-10-CM | POA: Diagnosis not present

## 2022-08-28 ENCOUNTER — Encounter: Payer: Self-pay | Admitting: Internal Medicine

## 2022-08-28 ENCOUNTER — Ambulatory Visit (INDEPENDENT_AMBULATORY_CARE_PROVIDER_SITE_OTHER): Payer: Medicare Other | Admitting: Internal Medicine

## 2022-08-28 VITALS — BP 136/82 | HR 61 | Ht 66.5 in | Wt 120.1 lb

## 2022-08-28 DIAGNOSIS — K648 Other hemorrhoids: Secondary | ICD-10-CM

## 2022-08-28 DIAGNOSIS — K642 Third degree hemorrhoids: Secondary | ICD-10-CM

## 2022-08-28 DIAGNOSIS — R150 Incomplete defecation: Secondary | ICD-10-CM | POA: Diagnosis not present

## 2022-08-28 NOTE — Progress Notes (Signed)
Patricia Dillon is a 72 year old female with history of symptomatic bleeding and prolapsing internal hemorrhoids who is here for additional hemorrhoidal banding  Symptoms prior to banding: Frequent rectal bleeding and prolapse  Hemorrhoidal banding has been performed to the right posterior and left lateral hemorrhoid  She has noticed more incomplete evacuation of late and 3 stools a day rather than 1 formed complete stool  Has not felt much improvement after the second banding though had some moderate improvement after the first   PROCEDURE NOTE:  The patient presents with symptomatic grade 3 internal hemorrhoids, requesting rubber band ligation of his her hemorrhoidal disease.  All risks, benefits and alternative forms of therapy were described and informed consent was obtained.  ANOSCOPY: Using a disposable, lubricated, slotted, self-illuminating anoscope, the rectum was intubated without difficulty. The trochar was removed and the ano-rectum was circumferentially inspected. There was internal hemorrhoid RA. There was no finding of an anorectal fissure. The rectal mucosa was not inflamed. No neoplasia or other pathology was identified. The inspection was well tolerated.   The anorectum was pre-medicated with 0.125% nitroglycerin ointment The decision was made to band the RA internal hemorrhoid, and the [facility] O'Regan System was used to perform band ligation without complication.   Digital anorectal examination was then performed to assure proper positioning of the band, and to adjust the banded tissue as required.  The patient was discharged home without pain or other issues.  Dietary and behavioral recommendations were given and along with follow-up instructions.     The following adjunctive treatments were recommended: -- I would like her to add Benefiber to try to bulk stools, prevent incomplete evacuation as well as hard stool and mild constipation  The patient will return as scheduled  for follow-up and possible additional banding as required. No complications were encountered and the patient tolerated the procedure well.

## 2022-08-28 NOTE — Patient Instructions (Signed)
Restart the Benefiber daily.   Please MyChart message our office around September 10th to update [place] on your symptoms.  HEMORRHOID BANDING PROCEDURE    FOLLOW-UP CARE   The procedure you have had should have been relatively painless since the banding of the area involved does not have nerve endings and there is no pain sensation.  The rubber band cuts off the blood supply to the hemorrhoid and the band may fall off as soon as 48 hours after the banding (the band may occasionally be seen in the toilet bowl following a bowel movement). You may notice a temporary feeling of fullness in the rectum which should respond adequately to plain Tylenol or Motrin.  Following the banding, avoid strenuous exercise that evening and resume full activity the next day.  A sitz bath (soaking in a warm tub) or bidet is soothing, and can be useful for cleansing the area after bowel movements.     To avoid constipation, take two tablespoons of natural wheat bran, natural oat bran, flax, Benefiber or any over the counter fiber supplement and increase your water intake to 7-8 glasses daily.    Unless you have been prescribed anorectal medication, do not put anything inside your rectum for two weeks: No suppositories, enemas, fingers, etc.  Occasionally, you may have more bleeding than usual after the banding procedure.  This is often from the untreated hemorrhoids rather than the treated one.  Don't be concerned if there is a tablespoon or so of blood.  If there is more blood than this, lie flat with your bottom higher than your head and apply an ice pack to the area. If the bleeding does not stop within a half an hour or if you feel faint, call our office at [phone] or go to the emergency room.  Problems are not common; however, if there is a substantial amount of bleeding, severe pain, chills, fever or difficulty passing urine (very rare) or other problems, you should call us at [phone] or report to  the nearest emergency room.  Do not stay seated continuously for more than 2-3 hours for a day or two after the procedure.  Tighten your buttock muscles 10-15 times every two hours and take 10-15 deep breaths every 1-2 hours.  Do not spend more than a few minutes on the toilet if you cannot empty your bowel; instead re-visit the toilet at a later time.

## 2022-09-05 ENCOUNTER — Ambulatory Visit
Admission: RE | Admit: 2022-09-05 | Discharge: 2022-09-05 | Disposition: A | Discharge status: Home / Self Care | Payer: Medicare Other | Source: Ambulatory Visit | Attending: Obstetrics and Gynecology | Admitting: Obstetrics and Gynecology

## 2022-09-05 DIAGNOSIS — Z1231 Encounter for screening mammogram for malignant neoplasm of breast: Secondary | ICD-10-CM

## 2022-09-05 LAB — HM MAMMOGRAPHY

## 2022-09-06 ENCOUNTER — Encounter: Payer: Self-pay | Admitting: Internal Medicine

## 2022-09-11 DIAGNOSIS — H5712 Ocular pain, left eye: Secondary | ICD-10-CM | POA: Diagnosis not present

## 2022-09-11 DIAGNOSIS — H02054 Trichiasis without entropian left upper eyelid: Secondary | ICD-10-CM | POA: Diagnosis not present

## 2022-09-12 ENCOUNTER — Ambulatory Visit (INDEPENDENT_AMBULATORY_CARE_PROVIDER_SITE_OTHER): Payer: Medicare Other | Admitting: Internal Medicine

## 2022-09-12 ENCOUNTER — Encounter: Payer: Self-pay | Admitting: Internal Medicine

## 2022-09-12 VITALS — BP 142/80 | HR 54 | Temp 98.5°F | Ht 66.5 in | Wt 120.0 lb

## 2022-09-12 DIAGNOSIS — E538 Deficiency of other specified B group vitamins: Secondary | ICD-10-CM

## 2022-09-12 DIAGNOSIS — H02055 Trichiasis without entropian left lower eyelid: Secondary | ICD-10-CM | POA: Diagnosis not present

## 2022-09-12 DIAGNOSIS — E782 Mixed hyperlipidemia: Secondary | ICD-10-CM

## 2022-09-12 DIAGNOSIS — E041 Nontoxic single thyroid nodule: Secondary | ICD-10-CM

## 2022-09-12 DIAGNOSIS — L299 Pruritus, unspecified: Secondary | ICD-10-CM | POA: Diagnosis not present

## 2022-09-12 DIAGNOSIS — E559 Vitamin D deficiency, unspecified: Secondary | ICD-10-CM

## 2022-09-12 NOTE — Patient Instructions (Signed)
I would try a different shampoo and see if this helps. We will do the labs fasting.

## 2022-09-12 NOTE — Progress Notes (Signed)
Subjective:   Patient ID: Patricia Dillon, female    DOB: May 03, 1950, 72 y.o.   MRN: [id number]  HPI The patient is a 72 YO female coming in for itching neck. Has dermatology visit and overall improving slightly. Has changed hair product.   Review of Systems  Constitutional: Negative.   HENT: Negative.    Eyes: Negative.   Respiratory:  Negative for cough, chest tightness and shortness of breath.   Cardiovascular:  Negative for chest pain, palpitations and leg swelling.  Gastrointestinal:  Negative for abdominal distention, abdominal pain, constipation, diarrhea, nausea and vomiting.  Musculoskeletal: Negative.   Skin: Negative.        itching  Neurological: Negative.   Psychiatric/Behavioral: Negative.      Objective:  Physical Exam Constitutional:      Appearance: She is well-developed.  HENT:     Head: Normocephalic and atraumatic.  Cardiovascular:     Rate and Rhythm: Normal rate and regular rhythm.  Pulmonary:     Effort: Pulmonary effort is normal. No respiratory distress.     Breath sounds: Normal breath sounds. No wheezing or rales.  Abdominal:     General: Bowel sounds are normal. There is no distension.     Palpations: Abdomen is soft.     Tenderness: There is no abdominal tenderness. There is no rebound.  Musculoskeletal:     Cervical back: Normal range of motion.  Skin:    General: Skin is warm and dry.  Neurological:     Mental Status: She is alert and oriented to person, place, and time.     Coordination: Coordination normal.     Vitals:   09/12/22 1425 09/12/22 1427  BP: (!) 142/80 (!) 142/80  Pulse: (!) 54   Temp: 98.5 F (36.9 C)   TempSrc: Oral   SpO2: 96%   Weight: 120 lb (54.4 kg)   Height: 5' 6.5" (1.689 m)     Assessment & Plan:  Visit time 15 minutes in face to face communication with patient and coordination of care, additional 5 minutes spent in record review, coordination or care, ordering tests, communicating/referring to other  healthcare professionals, documenting in medical records all on the same day of the visit for total time 20 minutes spent on the visit.

## 2022-09-15 DIAGNOSIS — L299 Pruritus, unspecified: Secondary | ICD-10-CM | POA: Insufficient documentation

## 2022-09-15 NOTE — Assessment & Plan Note (Signed)
Ordered lipid panel to get done prior to next visit and adjust as needed.

## 2022-09-15 NOTE — Assessment & Plan Note (Signed)
Checking TSH to assess new itching.

## 2022-09-15 NOTE — Assessment & Plan Note (Signed)
Checking CBC and CMP and B12 and TSH and vitamin D to assess new itching.

## 2022-09-15 NOTE — Assessment & Plan Note (Signed)
Checking B12 level given new itching to assess.

## 2022-09-19 ENCOUNTER — Encounter: Payer: Self-pay | Admitting: Internal Medicine

## 2022-09-19 NOTE — Telephone Encounter (Signed)
Can sch 1 more banding visit JMP

## 2022-09-21 ENCOUNTER — Encounter: Payer: Self-pay | Admitting: Internal Medicine

## 2022-09-22 ENCOUNTER — Other Ambulatory Visit (INDEPENDENT_AMBULATORY_CARE_PROVIDER_SITE_OTHER): Payer: Medicare Other

## 2022-09-22 DIAGNOSIS — E782 Mixed hyperlipidemia: Secondary | ICD-10-CM

## 2022-09-22 DIAGNOSIS — E041 Nontoxic single thyroid nodule: Secondary | ICD-10-CM | POA: Diagnosis not present

## 2022-09-22 DIAGNOSIS — E559 Vitamin D deficiency, unspecified: Secondary | ICD-10-CM | POA: Diagnosis not present

## 2022-09-22 DIAGNOSIS — E538 Deficiency of other specified B group vitamins: Secondary | ICD-10-CM | POA: Diagnosis not present

## 2022-09-22 LAB — COMPREHENSIVE METABOLIC PANEL
ALT: 20 U/L (ref 0–35)
AST: 30 U/L (ref 0–37)
Albumin: 4.3 g/dL (ref 3.5–5.2)
Alkaline Phosphatase: 93 U/L (ref 39–117)
BUN: 19 mg/dL (ref 6–23)
CO2: 29 mEq/L (ref 19–32)
Calcium: 9.3 mg/dL (ref 8.4–10.5)
Chloride: 101 mEq/L (ref 96–112)
Creatinine, Ser: 1 mg/dL (ref 0.40–1.20)
GFR: 56.95 mL/min — ABNORMAL LOW (ref >60.00)
Glucose, Bld: 89 mg/dL (ref 70–99)
Potassium: 3.8 mEq/L (ref 3.5–5.1)
Sodium: 140 mEq/L (ref 135–145)
Total Bilirubin: 0.9 mg/dL (ref 0.2–1.2)
Total Protein: 7 g/dL (ref 6.0–8.3)

## 2022-09-22 LAB — LIPID PANEL
Cholesterol: 200 mg/dL (ref 0–200)
HDL: 62.9 mg/dL (ref >39.00)
LDL Cholesterol: 115 mg/dL — ABNORMAL HIGH (ref 0–99)
NonHDL: 137.35
Total CHOL/HDL Ratio: 3
Triglycerides: 113 mg/dL (ref 0.0–149.0)
VLDL: 22.6 mg/dL (ref 0.0–40.0)

## 2022-09-22 LAB — TSH: TSH: 2.44 u[IU]/mL (ref 0.35–5.50)

## 2022-09-22 LAB — VITAMIN D 25 HYDROXY (VIT D DEFICIENCY, FRACTURES): VITD: 43.19 ng/mL (ref 30.00–100.00)

## 2022-09-22 LAB — CBC
HCT: 41.1 % (ref 36.0–46.0)
Hemoglobin: 13.1 g/dL (ref 12.0–15.0)
MCHC: 31.8 g/dL (ref 30.0–36.0)
MCV: 93.7 fl (ref 78.0–100.0)
Platelets: 114 10*3/uL — ABNORMAL LOW (ref 150.0–400.0)
RBC: 4.38 Mil/uL (ref 3.87–5.11)
RDW: 13.8 % (ref 11.5–15.5)
WBC: 5.5 10*3/uL (ref 4.0–10.5)

## 2022-09-22 LAB — VITAMIN B12: Vitamin B-12: 888 pg/mL (ref 211–911)

## 2022-09-25 ENCOUNTER — Encounter: Payer: Self-pay | Admitting: Internal Medicine

## 2022-10-04 DIAGNOSIS — Z23 Encounter for immunization: Secondary | ICD-10-CM | POA: Diagnosis not present

## 2022-10-17 ENCOUNTER — Ambulatory Visit: Payer: Medicare Other | Admitting: Internal Medicine

## 2022-10-18 ENCOUNTER — Encounter: Payer: Self-pay | Admitting: Internal Medicine

## 2022-10-18 ENCOUNTER — Ambulatory Visit: Payer: Medicare Other | Admitting: Internal Medicine

## 2022-10-18 VITALS — BP 146/80 | HR 56 | Temp 98.0°F | Ht 66.5 in | Wt 120.0 lb

## 2022-10-18 DIAGNOSIS — R002 Palpitations: Secondary | ICD-10-CM | POA: Diagnosis not present

## 2022-10-18 DIAGNOSIS — Z23 Encounter for immunization: Secondary | ICD-10-CM | POA: Diagnosis not present

## 2022-10-18 NOTE — Progress Notes (Signed)
   Subjective:   Patient ID: Patricia Dillon, female    DOB: 07-21-1950, 72 y.o.   MRN: [id number]  HPI The patient is a 72 YO female coming in for questions about BP.   Review of Systems  Constitutional: Negative.   HENT: Negative.    Eyes: Negative.   Respiratory:  Negative for cough, chest tightness and shortness of breath.   Cardiovascular:  Negative for chest pain, palpitations and leg swelling.  Gastrointestinal:  Negative for abdominal distention, abdominal pain, constipation, diarrhea, nausea and vomiting.  Musculoskeletal: Negative.   Skin: Negative.   Neurological: Negative.   Psychiatric/Behavioral: Negative.      Objective:  Physical Exam Constitutional:      Appearance: She is well-developed.  HENT:     Head: Normocephalic and atraumatic.  Cardiovascular:     Rate and Rhythm: Normal rate and regular rhythm.  Pulmonary:     Effort: Pulmonary effort is normal. No respiratory distress.     Breath sounds: Normal breath sounds. No wheezing or rales.  Abdominal:     General: Bowel sounds are normal. There is no distension.     Palpations: Abdomen is soft.     Tenderness: There is no abdominal tenderness. There is no rebound.  Musculoskeletal:     Cervical back: Normal range of motion.  Skin:    General: Skin is warm and dry.  Neurological:     Mental Status: She is alert and oriented to person, place, and time.     Coordination: Coordination normal.     Vitals:   10/18/22 0830 10/18/22 0832  BP: (!) 146/80 (!) 146/80  Pulse: (!) 56   Temp: 98 F (36.7 C)   TempSrc: Oral   SpO2: 98%   Weight: 120 lb (54.4 kg)   Height: 5' 6.5" (1.689 m)    EKG: Rate 51, axis normal, interval normal, sinus brady, no st or t wave changes, no significant change compared to prior 2022   Assessment & Plan:  Prevnar 20 given at visit

## 2022-10-18 NOTE — Patient Instructions (Signed)
We have given you the prevnar 20 pneumonia vaccine today. Get the shingles at the pharmacy if you want.

## 2022-10-20 DIAGNOSIS — R002 Palpitations: Secondary | ICD-10-CM | POA: Insufficient documentation

## 2022-10-20 NOTE — Assessment & Plan Note (Signed)
She has been having some palpitations with standing suddenly after sitting awhile. EKG done which is normal. She is having lower than usual HR over the last year. She has increased exercise in the same timeframe so likely normal. She is in 50s typically when checked. BP at home running 130s/70s average. No treatment needed for BP although mildly high today. Continue close monitoring. Prior holter monitoring which was normal.

## 2022-10-30 ENCOUNTER — Telehealth: Payer: Self-pay | Admitting: Internal Medicine

## 2022-10-30 NOTE — Telephone Encounter (Signed)
Patricia Dillon said you have some availability for possible banding. This is my patient with banding x 3 and still having bleeding. Could or would you be available to see her for anoscopy and additional banding if appropriate?  I am hospital this week If not possible, I will ask others Thanks JMP

## 2022-10-30 NOTE — Telephone Encounter (Signed)
Inbound call from patient requesting a call to discuss sooner banding appointment. States he bleeding is getting worse. Requesting a follow up call. Please advise, thank you.

## 2022-10-30 NOTE — Telephone Encounter (Signed)
Pt scheduled for hem banding with Dr. Rhea Belton on 11/26. She is requesting a sooner appt. There are available banding appts with Dr. Russella Dar and with Dr. Tomasa Rand. Please advise if ok to schedule with another provider.

## 2022-10-31 NOTE — Telephone Encounter (Signed)
Offered pt an appt with Dr. Tomasa Rand on 11/06/22 for banding. Pt states she has an appt with Dr. Rhea Belton for 11/26 and she will just keep that appt as scheduled.

## 2022-11-06 ENCOUNTER — Encounter: Payer: Medicare Other | Admitting: Gastroenterology

## 2022-11-07 ENCOUNTER — Encounter: Payer: Medicare Other | Admitting: Internal Medicine

## 2022-11-09 DIAGNOSIS — H02054 Trichiasis without entropian left upper eyelid: Secondary | ICD-10-CM | POA: Diagnosis not present

## 2022-11-09 DIAGNOSIS — H5712 Ocular pain, left eye: Secondary | ICD-10-CM | POA: Diagnosis not present

## 2022-11-21 DIAGNOSIS — H02054 Trichiasis without entropian left upper eyelid: Secondary | ICD-10-CM | POA: Diagnosis not present

## 2022-11-21 DIAGNOSIS — H2513 Age-related nuclear cataract, bilateral: Secondary | ICD-10-CM | POA: Diagnosis not present

## 2022-11-28 ENCOUNTER — Encounter: Payer: Self-pay | Admitting: Internal Medicine

## 2022-11-28 ENCOUNTER — Ambulatory Visit: Payer: Medicare Other | Admitting: Internal Medicine

## 2022-11-28 VITALS — BP 124/70 | HR 62 | Ht 66.5 in | Wt 119.0 lb

## 2022-11-28 DIAGNOSIS — K648 Other hemorrhoids: Secondary | ICD-10-CM

## 2022-11-28 DIAGNOSIS — L290 Pruritus ani: Secondary | ICD-10-CM

## 2022-11-28 DIAGNOSIS — K642 Third degree hemorrhoids: Secondary | ICD-10-CM | POA: Diagnosis not present

## 2022-11-28 DIAGNOSIS — K6289 Other specified diseases of anus and rectum: Secondary | ICD-10-CM

## 2022-11-28 DIAGNOSIS — R150 Incomplete defecation: Secondary | ICD-10-CM

## 2022-11-28 NOTE — Patient Instructions (Addendum)
A high fiber diet with plenty of fluids (up to 8 glasses of water daily) is suggested to relieve these symptoms.  Metamucil Gummy  once or twice daily can be used to keep bowels regular if needed.  Please purchase the following medications over the counter and take as directed: Desitin Cream - use as directed     HEMORRHOID BANDING PROCEDURE    FOLLOW-UP CARE   The procedure you have had should have been relatively painless since the banding of the area involved does not have nerve endings and there is no pain sensation.  The rubber band cuts off the blood supply to the hemorrhoid and the band may fall off as soon as 48 hours after the banding (the band may occasionally be seen in the toilet bowl following a bowel movement). You may notice a temporary feeling of fullness in the rectum which should respond adequately to plain Tylenol or Motrin.  Following the banding, avoid strenuous exercise that evening and resume full activity the next day.  A sitz bath (soaking in a warm tub) or bidet is soothing, and can be useful for cleansing the area after bowel movements.     To avoid constipation, take two tablespoons of natural wheat bran, natural oat bran, flax, Benefiber or any over the counter fiber supplement and increase your water intake to 7-8 glasses daily.    Unless you have been prescribed anorectal medication, do not put anything inside your rectum for two weeks: No suppositories, enemas, fingers, etc.  Occasionally, you may have more bleeding than usual after the banding procedure.  This is often from the untreated hemorrhoids rather than the treated one.  Don't be concerned if there is a tablespoon or so of blood.  If there is more blood than this, lie flat with your bottom higher than your head and apply an ice pack to the area. If the bleeding does not stop within a half an hour or if you feel faint, call our office at [phone] or go to the emergency room.  Problems are not  common; however, if there is a substantial amount of bleeding, severe pain, chills, fever or difficulty passing urine (very rare) or other problems, you should call us at [phone] or report to the nearest emergency room.  Do not stay seated continuously for more than 2-3 hours for a day or two after the procedure.  Tighten your buttock muscles 10-15 times every two hours and take 10-15 deep breaths every 1-2 hours.  Do not spend more than a few minutes on the toilet if you cannot empty your bowel; instead re-visit the toilet at a later time.    _______________________________________________________  If your blood pressure at your visit was 140/90 or greater, please contact your primary care physician to follow up on this.  _______________________________________________________  If you are age 43 or older, your body mass index should be between 23-30. Your Body mass index is 18.92 kg/m. If this is out of the aforementioned range listed, please consider follow up with your Primary Care Provider.  If you are age 25 or younger, your body mass index should be between 19-25. Your Body mass index is 18.92 kg/m. If this is out of the aformentioned range listed, please consider follow up with your Primary Care Provider.   ________________________________________________________  The [facility] providers would like to encourage you to use [facility] to communicate with providers for non-urgent requests or questions.  Due to long hold times on  the telephone, sending your provider a message by [facility] may be a faster and more efficient way to get a response.  Please allow 48 business hours for a response.  Please remember that this is for non-urgent requests.  _______________________________________________________  Follow-up as needed.   Thank you for choosing me and [facility].  Dr. Vonna Kotyk Pyrtle

## 2022-11-28 NOTE — Progress Notes (Signed)
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  Symptoms prior to banding were frequent rectal bleeding and prolapse  She has had some recurrent bleeding predominantly with wiping.  No pain or itching.  She feels that the prolapsed hemorrhoid is now smaller post hemorrhoidal banding.  Her bowel movements of late have been more complete typically in the morning.  She does report that she has a tendency to over wipe or aggressively wipe to ensure that she is completely clean and this can exacerbate her minor bleeding.  She has tried [employer] as well as Metamucil powder but this seems to irritate her tongue.  She has a steroid cream which dermatology is recommended she use once a week for her lichen sclerosus but she is not always doing this.   PROCEDURE NOTE:  The patient presents with symptomatic grade 3 internal hemorrhoids, requesting rubber band ligation of her hemorrhoidal disease.  All risks, benefits and alternative forms of therapy were described and informed consent was obtained.  Rectal exam reveals a small prolapsed inflamed internal hemorrhoid which easily reduces, no tenderness or anal fissure noted  The anorectum was pre-medicated with 0.125% nitroglycerin ointment The decision was made to band the RA internal hemorrhoid, and the [facility] O'Regan System was used to perform band ligation without complication.   Digital anorectal examination was then performed to assure proper positioning of the band, and to adjust the banded tissue as required.  The patient was discharged home without pain or other issues.  Dietary and behavioral recommendations were given and along with follow-up instructions.    The patient will return as needed for follow-up.  If she continues to have symptoms I wonder if thermotherapy with RFA  ablation would be the next best option for her.  No complications were encountered and the patient tolerated the procedure well.  2.  Incomplete bowel movements --add back fiber but we will have her try Metamucil Gummies given the tongue irritation with the Metamucil powder.  3.  Perianal irritation --we discussed the importance of not aggressively wiping and avoid "over-wiping" which is probably exacerbating symptoms.  I also recommend she try some creamy Desitin

## 2022-12-08 DIAGNOSIS — L820 Inflamed seborrheic keratosis: Secondary | ICD-10-CM | POA: Diagnosis not present

## 2022-12-08 DIAGNOSIS — L570 Actinic keratosis: Secondary | ICD-10-CM | POA: Diagnosis not present

## 2022-12-11 ENCOUNTER — Ambulatory Visit (INDEPENDENT_AMBULATORY_CARE_PROVIDER_SITE_OTHER): Payer: Medicare Other | Admitting: Podiatry

## 2022-12-11 ENCOUNTER — Encounter: Payer: Self-pay | Admitting: Podiatry

## 2022-12-11 DIAGNOSIS — M216X2 Other acquired deformities of left foot: Secondary | ICD-10-CM | POA: Diagnosis not present

## 2022-12-11 DIAGNOSIS — L84 Corns and callosities: Secondary | ICD-10-CM | POA: Diagnosis not present

## 2022-12-11 DIAGNOSIS — M216X1 Other acquired deformities of right foot: Secondary | ICD-10-CM

## 2022-12-11 NOTE — Progress Notes (Unsigned)
Subjective: Chief Complaint  Patient presents with   Callouses    RM#12 callouses on both feet     72 y.o. female presents the office with above concerns, foot pain bilaterally.  She states that the calluses on both feet have started to hurt again.  Denies any open lesions or any drainage.  No swelling or redness.  No recent injuries.  She has no other concerns today.  Objective: AAO x3, NAD DP/PT pulses palpable bilaterally, CRT less than 3 seconds Hyperkeratotic lesion on the right foot submetatarsal 3 as well as notably submetatarsal 5 on the right foot.  Prominent metatarsal heads plantarly with atrophy of the fat pad.  Lesion also noted on the left foot on the medial aspect of the second digit.  Upon debridement there is no underlying ulceration drainage or signs of infection.  Prominence of metatarsals plantarly with atrophy the fat pad. No pain with calf compression, swelling, warmth, erythema  Assessment: Hyperkeratotic lesion right foot due to prominent metatarsal heads  Plan: -All treatment options discussed with the patient including all alternatives, risks, complications.  -Sharply debrided x 3 without any complications or bleeding as a courtesy. We discussed offloading, shoe modifications to help decrease pressure to minimize recurrence. Moisturizer daily. Monitor for any skin breakdown.  -Patient encouraged to call the office with any questions, concerns, change in symptoms.   Vivi Barrack DPM

## 2022-12-14 ENCOUNTER — Ambulatory Visit: Payer: Medicare Other | Admitting: Podiatry

## 2022-12-19 ENCOUNTER — Encounter: Payer: Self-pay | Admitting: Internal Medicine

## 2023-01-11 DIAGNOSIS — H02421 Myogenic ptosis of right eyelid: Secondary | ICD-10-CM | POA: Diagnosis not present

## 2023-01-11 DIAGNOSIS — H02055 Trichiasis without entropian left lower eyelid: Secondary | ICD-10-CM | POA: Diagnosis not present

## 2023-01-11 DIAGNOSIS — H02056 Trichiasis without entropian left eye, unspecified eyelid: Secondary | ICD-10-CM | POA: Insufficient documentation

## 2023-01-11 DIAGNOSIS — H02883 Meibomian gland dysfunction of right eye, unspecified eyelid: Secondary | ICD-10-CM | POA: Insufficient documentation

## 2023-01-11 DIAGNOSIS — H02059 Trichiasis without entropian unspecified eye, unspecified eyelid: Secondary | ICD-10-CM | POA: Insufficient documentation

## 2023-01-11 DIAGNOSIS — H0289 Other specified disorders of eyelid: Secondary | ICD-10-CM | POA: Diagnosis not present

## 2023-03-16 DIAGNOSIS — H02055 Trichiasis without entropian left lower eyelid: Secondary | ICD-10-CM | POA: Diagnosis not present

## 2023-03-16 DIAGNOSIS — H02421 Myogenic ptosis of right eyelid: Secondary | ICD-10-CM | POA: Diagnosis not present

## 2023-03-16 DIAGNOSIS — H0289 Other specified disorders of eyelid: Secondary | ICD-10-CM | POA: Diagnosis not present

## 2023-03-26 ENCOUNTER — Encounter: Payer: Self-pay | Admitting: Podiatry

## 2023-03-26 ENCOUNTER — Ambulatory Visit (INDEPENDENT_AMBULATORY_CARE_PROVIDER_SITE_OTHER): Admitting: Podiatry

## 2023-03-26 DIAGNOSIS — D2371 Other benign neoplasm of skin of right lower limb, including hip: Secondary | ICD-10-CM

## 2023-03-26 NOTE — Progress Notes (Signed)
 Subjective: Chief Complaint  Patient presents with   Callouses    RM#13 Right foot callus also would like to discuss a spot on left foot.    73 y.o. female presents the office with above concerns, foot pain mostly in the right foot where she gets a callus.  No recent injuries or changes.  No swelling or redness.  No other concerns today.   Objective: AAO x3, NAD DP/PT pulses palpable bilaterally, CRT less than 3 seconds Hyperkeratotic lesion on the right foot submetatarsal 3 as well as notably submetatarsal 5 on the right foot.  There is Most discomfort submetatarsal 3 is a punctate annular hyperkeratotic lesion.  There is minimal callus formation the left second toe.  There is no ulcerations noted bilaterally.  No signs of infection bilaterally.  Prominent metatarsal heads plantarly.  No pain with calf compression, swelling, warmth, erythema  Assessment: Hyperkeratotic lesion right foot due to prominent metatarsal heads  Plan: -All treatment options discussed with the patient including all alternatives, risks, complications.  -Sharply debrided x 3 without any complications.  The lesion on the right foot cleaned the area with alcohol.  Cantharone was applied followed by occlusive bandage.  Postprocedure instructions discussed.  Monitoring signs or symptoms of infection.  Vivi Barrack DPM

## 2023-03-26 NOTE — Patient Instructions (Addendum)
 Take dressing off in 8 hours and wash the foot with soap and water. If it is hurting or becomes uncomfortable before the 8 hours, go ahead and remove the bandage and wash the area.  If it blisters, apply antibiotic ointment and a band-aid.  Monitor for any signs/symptoms of infection. Call the office immediately if any occur or go directly to the emergency room. Call with any questions/concerns.  --   Choose a moisturizer from  the list below:  For normal skin: Moisturize feet once daily; do not apply between toes A.  CeraVe Daily Moisturizing Lotion B.  Lubriderm Advanced Therapy Lotion or Lubriderm Intense Skin Repair Lotion C.  Aquaphor Intensive Repair Lotion D.  Gold Bond Ultimate Diabetic Foot Lotion E.  Eucerin Intensive Repair Moisturizing Lotion  For extremely dry, cracked feet: moisturize feet once daily; do not apply between toes A. CeraVe Healing Ointment B. Eucerin Aquaphor Repairing Ointment (may be labeled Aquaphor Healing Ointment) C. Vaseline Petroleum Healing Jelly   If you have problems reaching your feet: apply to feet once daily; do not apply between toes A.  Eucerin Aquaphor Ointment Body Spray  B.  Vaseline Intensive Care Spray Moisturizer (Unscented,  Cocoa Radiant Spray or Aloe Smooth Spray)

## 2023-03-29 ENCOUNTER — Encounter: Payer: Self-pay | Admitting: Podiatry

## 2023-03-29 NOTE — Telephone Encounter (Signed)
 Can we work her in? Thanks!

## 2023-04-02 ENCOUNTER — Encounter: Payer: Self-pay | Admitting: Internal Medicine

## 2023-04-13 ENCOUNTER — Encounter: Payer: Self-pay | Admitting: Internal Medicine

## 2023-04-16 NOTE — Telephone Encounter (Signed)
 Pt need to go to her local pharmacy since she has medicare?

## 2023-04-30 ENCOUNTER — Ambulatory Visit: Admitting: Podiatry

## 2023-05-04 DIAGNOSIS — Z681 Body mass index (BMI) 19 or less, adult: Secondary | ICD-10-CM | POA: Diagnosis not present

## 2023-05-04 DIAGNOSIS — R5383 Other fatigue: Secondary | ICD-10-CM | POA: Diagnosis not present

## 2023-05-04 DIAGNOSIS — Z01419 Encounter for gynecological examination (general) (routine) without abnormal findings: Secondary | ICD-10-CM | POA: Diagnosis not present

## 2023-05-14 NOTE — Progress Notes (Unsigned)
 Patient ID: Patricia Dillon, female   DOB: December 17, 1950, 73 y.o.   MRN: [id number]  HPI  Patricia Dillon is a 73 y.o.-year-old female, initially referred by her PCP, Dr. Margean Sheehan, for management of thyroid  nodule. She moved from [place]  in 10/2013. Last visit 1 year ago.  Interim history: She continues to have some sore throat due to post nasal drip related to deviated septum and allergies.  Also had enlarged lymph nodes in neck (this is recurrent for her).  Reviewed and addended history: Pt. has a history of a right thyroid  nodule that was incidentally found on MRI of the neck in 2010  - after a fall.  It was followed by serial ultrasounds:  Reviewed the reports and images of her thyroid  ultrasounds: 09/30/2008: 0.7 x 0.4 x 0.4 cm 05/07/2009: 0.56 x 0.36 x 0.47 cm 07/12/2010: 0.63 x 0.37 x 0.39 cm 10/27/2012: 0.72 x 0.55 x 0.71 cm - 2 hyperechoic areas that appear as microcalcifications. Thyroid  gland was homogeneous on U/S.    05/10/2015: Thyroid  ultrasound: Right thyroid  lobe: 40 x 13 x 14 mm.  - 9 x 7 x 8 mm solid nodule with coarse calcification, inferior pole (previously 7 x 6 x 7).  - 3 mm hypoechoic nodule, superior pole. Left thyroid  lobe: 42 x 9 x 11 mm.  - Two small colloid nodules less than 4 mm. Isthmus Thickness: 1.4 mm.  No nodules visualized. Lymphadenopathy: None visualized.   IMPRESSION: 1. Small bilateral nodules as above. Findings do not meet current consensus criteria for biopsy. Follow-up by clinical exam is recommended.    Reviewing the images, nodule appears isoechoic, and the "coarse calcification" appears to be a colloid granules with comet tail sign (benign finding).  Since the nodule appeared to be low risk for cancer, I did not suggest biopsy.  04/09/2019: Thyroid  ultrasound: Nodule # 1: Prior biopsy: No Location: Right; Inferior Maximum size: 1.2 cm; Other 2 dimensions: 0.9 x 0.9 cm, previously, 0.9 x 0.7 x 0.8 cm Composition: mixed cystic and solid  (1) Echogenicity: isoechoic (1) Echogenic foci: punctate echogenic foci (3)  *Given size (>/= 1 - 1.4 cm) and appearance, a follow-up ultrasound in 1 year should be considered based on TI-RADS criteria.   IMPRESSION: 1. Normal-sized thyroid  with solitary right nodule. Recommend annual/biennial ultrasound follow-up as above, until stability x5 years confirmed.    06/07/2020: Thyroid  ultrasound: Nodule # 1:  Prior biopsy: No  Location: Right; inferior  Maximum size: 1.3 cm; Other 2 dimensions: 0.9 x 0.8 cm, previously, 1.2 x 0.9 x 0.9 cm  Composition: solid/almost completely solid (2)  Echogenicity: isoechoic (1) Echogenic foci: punctate echogenic foci (3) ACR TI-RADS total points: 6. *Given size (>/= 1 - 1.4 cm) and appearance, a follow-up ultrasound in 1 year should be considered based on TI-RADS criteria. _________________________________________________________   IMPRESSION: Solitary TI-RADS 4 right thyroid  nodule is not significantly changed in size since prior exam.   Continued annual follow-up recommended until 5 year stability is confirmed.  06/27/2021: Thyroid  ultrasound: Parenchymal Echotexture: Moderately heterogenous  Isthmus: 0.2 cm thickness, stable  Right lobe: 4.1 x 0.9 x 1.6 cm, previously 4 x 1.3 x 1.5  Left lobe: 4 x 1.1 x 1.1 cm, previously 4.5 x 0.9 x 1  _________________________________________________________   Estimated total number of nodules >/= 1 cm: 1 _________________________________________________________   Nodule # 1:  Prior biopsy: No  Location: Right; inferior  Maximum size: 1.4 cm; Other 2 dimensions: 1.1 x 0.9 cm, previously, 1.3 x 0.9  x 0.8 cm  Composition: solid/almost completely solid (2)  Echogenicity: hypoechoic (2) *Given size (>/= 1 - 1.4 cm) and appearance, a follow-up ultrasound in 1 year should be considered based on TI-RADS criteria.  _________________________________________________________ No new thyroid  nodule.   No  regional cervical adenopathy.   IMPRESSION: 1. Stable solitary inferior right nodule. Recommend annual/biennial ultrasound follow-up, until stability x5 years confirmed.  Reviewed patient's TFTs: Lab Results  Component Value Date   TSH 2.44 09/22/2022   TSH 1.82 06/13/2021   TSH 2.17 05/07/2020   TSH 2.12 04/30/2019   TSH 2.900 07/11/2016   TSH 1.18 06/24/2015   FREET4 0.82 06/13/2021   FREET4 0.63 05/07/2020   FREET4 1.13 07/11/2016  02/22/2017 (Dr. Ashby Lawman): TSH 2.90 08/16/2012: TSH 2.480, fT4 1.23   Pt denies: - feeling nodules in neck - hoarseness - dysphagia - choking  Pt does not have a FH of thyroid  ds. No FH of thyroid  cancer. No h/o radiation tx to head or neck. No recent contrast studies. No herbal supplements. No Biotin use. On B12. No recent steroids use.   She also has a history of Mitral regurgitation.  She also has a history of osteopenia.  She is on vitamin D . She has lichen sclerosis >> she has clobetasol cream, but only uses it as needed.  ROS: + see HPI  I reviewed pt's medications, allergies, PMH, social hx, family hx, and changes were documented in the history of present illness. Otherwise, unchanged from my initial visit note.  Past Medical History:  Diagnosis Date   Diverticulosis    External hemorrhoids    Hyperlipidemia    Internal hemorrhoids    Lichen sclerosus    Mitral valve regurgitation    Osteopenia    SVT (supraventricular tachycardia) (HCC)    Tubular adenoma of colon    Vitamin B12 deficiency    Past Surgical History:  Procedure Laterality Date   BREAST BIOPSY Left    benign   BREAST EXCISIONAL BIOPSY Left    COLONOSCOPY     2002, 2006, 2012 at [place]    History   Social History   Marital Status: Divorced    Spouse Name: N/A   Number of Children: 2   Occupational History   Conservation officer, [employer].   Social History Main Topics   Smoking status: Never Smoker    Smokeless tobacco: Not on file   Alcohol Use: No   Drug  Use: No   Current Outpatient Medications on File Prior to Visit  Medication Sig Dispense Refill   betamethasone dipropionate (DIPROLENE) 0.05 % ointment Apply topically.     Cholecalciferol (VITAMIN D3) 50 MCG (2000 UT) capsule Take 2,000 Units by mouth daily.     clobetasol cream (TEMOVATE) 0.05 % Apply 1 application  topically as needed.     vitamin B-12 (CYANOCOBALAMIN ) 500 MCG tablet Take 500 mcg by mouth daily. Brand: Megafood, includes Folate 340 mcg, vit B6 - 8 mg, includes food blend     No current facility-administered medications on file prior to visit.   Allergies  Allergen Reactions   Vancomycin Itching   Penicillins Rash   Azithromycin  Hives and Itching   Family History  Problem Relation Age of Onset   Hypertension Mother    Heart disease Father    Melanoma Daughter        age 61   Colon polyps Daughter    Colon polyps Son    Colon cancer Neg Hx    Esophageal cancer Neg Hx  Rectal cancer Neg Hx    Ulcerative colitis Neg Hx    PE: There were no vitals taken for this visit.  Wt Readings from Last 3 Encounters:  11/28/22 119 lb (54 kg)  10/18/22 120 lb (54.4 kg)  09/12/22 120 lb (54.4 kg)   Constitutional:normal weight, in NAD Eyes:  EOMI, no exophthalmos ENT: no neck masses, no cervical lymphadenopathy Cardiovascular: RRR, No MRG Respiratory: CTA B Musculoskeletal: no deformities Skin:no rashes Neurological: no tremor with outstretched hands  ASSESSMENT: 1. R Thyroid  nodule  PLAN: 1.  Thyroid  nodule - Patient with a history of a right thyroid  nodule observed on the ultrasound from 2014, 17, 2021, 22, 23.  The nodule was very small on the initial ultrasound, without internal blood flow, taller than wide distribution and irregular contours.it was also isoechoic or and contains 2 colloid granules (LADA microcalcifications as initially characterized on the report), a benign finding, due to inspissated colloid.  The nodule was stable over 8 years between  2010 and 2017.  On the ultrasound from 2021, it appears to have a larger cholate component and this was likely the reason why it appeared to be larger.  The recommendation was to repeat the ultrasound in a year.  A repeat ultrasound in 2022 showed that the nodule was stable, measuring 1.3 x 0.9 x 0.8 cm, isoechoic, and without worrisome characteristics.  Due to the punctate hyperechogenic foci, the recommendation was made to continue to follow it for 5 years to document stability.  Latest thyroid  ultrasound is from 06/2021 and showed stability of the nodule, requiring only follow-up in a year but no other intervention.  At last visit, in 2024, we discussed about keeping the ultrasound at that time and repeating it this year.  She agreed with the plan. -At today's visit, she denies neck compression symptoms and does not have masses on palpation of her neck.  She previously had increased postnasal drip after drinking milk but she switched to flax milk and this improved. -Latest TSH was normal in 09/2022.  We will not repeat this today - We will repeat another ultrasound now -I will see her in 1 year  Emilie Harden, MD PhD [facility]

## 2023-05-15 ENCOUNTER — Ambulatory Visit (INDEPENDENT_AMBULATORY_CARE_PROVIDER_SITE_OTHER): Payer: Medicare Other | Admitting: Internal Medicine

## 2023-05-15 ENCOUNTER — Encounter: Payer: Self-pay | Admitting: Internal Medicine

## 2023-05-15 VITALS — BP 120/70 | HR 56 | Ht 66.5 in | Wt 117.4 lb

## 2023-05-15 DIAGNOSIS — E041 Nontoxic single thyroid nodule: Secondary | ICD-10-CM | POA: Diagnosis not present

## 2023-05-15 NOTE — Patient Instructions (Addendum)
 Let's check another thyroid  ultrasound.  Please return 1 year.

## 2023-05-18 DIAGNOSIS — H02055 Trichiasis without entropian left lower eyelid: Secondary | ICD-10-CM | POA: Diagnosis not present

## 2023-05-18 DIAGNOSIS — H269 Unspecified cataract: Secondary | ICD-10-CM | POA: Insufficient documentation

## 2023-05-18 DIAGNOSIS — H0289 Other specified disorders of eyelid: Secondary | ICD-10-CM | POA: Diagnosis not present

## 2023-05-18 DIAGNOSIS — H02401 Unspecified ptosis of right eyelid: Secondary | ICD-10-CM | POA: Insufficient documentation

## 2023-05-18 DIAGNOSIS — H02421 Myogenic ptosis of right eyelid: Secondary | ICD-10-CM | POA: Diagnosis not present

## 2023-05-18 DIAGNOSIS — H04123 Dry eye syndrome of bilateral lacrimal glands: Secondary | ICD-10-CM | POA: Insufficient documentation

## 2023-05-29 ENCOUNTER — Ambulatory Visit (INDEPENDENT_AMBULATORY_CARE_PROVIDER_SITE_OTHER)

## 2023-05-29 ENCOUNTER — Telehealth: Payer: Self-pay | Admitting: Internal Medicine

## 2023-05-29 VITALS — Ht 66.5 in | Wt 117.0 lb

## 2023-05-29 DIAGNOSIS — Z1159 Encounter for screening for other viral diseases: Secondary | ICD-10-CM

## 2023-05-29 DIAGNOSIS — Z0000 Encounter for general adult medical examination without abnormal findings: Secondary | ICD-10-CM | POA: Diagnosis not present

## 2023-05-29 NOTE — Progress Notes (Signed)
 Subjective:   Patricia Dillon is a 73 y.o. who presents for a Medicare Wellness preventive visit.  As a reminder, Annual Wellness Visits don't include a physical exam, and some assessments may be limited, especially if this visit is performed virtually. We may recommend an in-person follow-up visit with your provider if needed.  Visit Complete: Virtual I connected with  Patricia Dillon on 05/29/23 by a audio enabled telemedicine application and verified that I am speaking with the correct person using two identifiers.  Patient Location: Home  Provider Location: Home Office  I discussed the limitations of evaluation and management by telemedicine. The patient expressed understanding and agreed to proceed.  Vital Signs: Because this visit was a virtual/telehealth visit, some criteria may be missing or patient reported. Any vitals not documented were not able to be obtained and vitals that have been documented are patient reported.  VideoDeclined- This patient declined Librarian, academic. Therefore the visit was completed with audio only.  Persons Participating in Visit: Patient.  AWV Questionnaire: Yes: Patient Medicare AWV questionnaire was completed by the patient on 05/24/2023; I have confirmed that all information answered by patient is correct and no changes since this date.  Cardiac Risk Factors include: advanced age (>32men, >83 women);dyslipidemia     Objective:     Today's Vitals   05/29/23 1401  Weight: 117 lb (53.1 kg)  Height: 5' 6.5" (1.689 m)   Body mass index is 18.6 kg/m.     05/29/2023    2:08 PM 05/26/2022    9:39 AM 05/17/2021    9:41 AM 03/22/2018    9:01 PM  Advanced Directives  Does Patient Have a Medical Advance Directive? Yes Yes Yes No  Type of Estate agent of [employer];Living will Healthcare Power of [place];Living will Healthcare Power of Attorney   Does patient want to make changes to medical advance  directive?  No - Patient declined    Copy of Healthcare Power of Attorney in Chart? No - copy requested No - copy requested No - copy requested   Would patient like information on creating a medical advance directive?    No - Patient declined    Current Medications (verified) Outpatient Encounter Medications as of 05/29/2023  Medication Sig   betamethasone dipropionate (DIPROLENE) 0.05 % ointment Apply topically.   Cholecalciferol (VITAMIN D3) 50 MCG (2000 UT) capsule Take 2,000 Units by mouth daily.   vitamin B-12 (CYANOCOBALAMIN ) 500 MCG tablet Take 500 mcg by mouth daily. Brand: Megafood, includes Folate 340 mcg, vit B6 - 8 mg, includes food blend   clobetasol cream (TEMOVATE) 0.05 % Apply 1 application  topically as needed. (Patient not taking: Reported on 05/29/2023)   No facility-administered encounter medications on file as of 05/29/2023.    Allergies (verified) Vancomycin, Penicillins, and Azithromycin    History: Past Medical History:  Diagnosis Date   Diverticulosis    External hemorrhoids    Hyperlipidemia    Internal hemorrhoids    Lichen sclerosus    Mitral valve regurgitation    Osteopenia    SVT (supraventricular tachycardia) (HCC)    Tubular adenoma of colon    Vitamin B12 deficiency    Past Surgical History:  Procedure Laterality Date   BREAST BIOPSY Left    benign   BREAST EXCISIONAL BIOPSY Left    COLONOSCOPY     2002, 2006, 2012 at [place]    Family History  Problem Relation Age of Onset   Hypertension Mother  Heart disease Father    Melanoma Daughter        age 67   Colon polyps Daughter    Colon polyps Son    Colon cancer Neg Hx    Esophageal cancer Neg Hx    Rectal cancer Neg Hx    Ulcerative colitis Neg Hx    Social History   Socioeconomic History   Marital status: Divorced    Spouse name: Not on file   Number of children: 2   Years of education: Not on file   Highest education level: Bachelor's degree (e.g., BA, AB, BS)   Occupational History   Occupation: retired  Tobacco Use   Smoking status: Never   Smokeless tobacco: Never  Vaping Use   Vaping status: Never Used  Substance and Sexual Activity   Alcohol use: No    Alcohol/week: 0.0 standard drinks of alcohol   Drug use: No   Sexual activity: Not on file  Other Topics Concern   Not on file  Social History Narrative   Lives alone/2025   Social Drivers of Health   Financial Resource Strain: Low Risk  (05/24/2023)   Overall Financial Resource Strain (CARDIA)    Difficulty of Paying Living Expenses: Not hard at all  Food Insecurity: No Food Insecurity (05/24/2023)   Hunger Vital Sign    Worried About Running Out of Food in the Last Year: Never true    Ran Out of Food in the Last Year: Never true  Transportation Needs: No Transportation Needs (05/24/2023)   PRAPARE - Administrator, Civil Service (Medical): No    Lack of Transportation (Non-Medical): No  Physical Activity: Insufficiently Active (05/24/2023)   Exercise Vital Sign    Days of Exercise per Week: 2 days    Minutes of Exercise per Session: 60 min  Stress: No Stress Concern Present (05/24/2023)   [employer] of Occupational Health - Occupational Stress Questionnaire    Feeling of Stress : Only a little  Social Connections: Socially Isolated (05/24/2023)   Social Connection and Isolation Panel [NHANES]    Frequency of Communication with Friends and Family: More than three times a week    Frequency of Social Gatherings with Friends and Family: More than three times a week    Attends Religious Services: Never    Database administrator or Organizations: No    Attends Engineer, structural: Not on file    Marital Status: Divorced    Tobacco Counseling Counseling given: Not Answered    Clinical Intake:  Pre-visit preparation completed: Yes  Pain : No/denies pain     BMI - recorded: 18.6 Nutritional Status: BMI <19  Underweight Nutritional Risks:  None Diabetes: No  No results found for: "HGBA1C"   How often do you need to have someone help you when you read instructions, pamphlets, or other written materials from your doctor or pharmacy?: 1 - Never  Interpreter Needed?: No  Information entered by :: Ashli Selders, RMA   Activities of Daily Living     05/24/2023   12:00 PM  In your present state of health, do you have any difficulty performing the following activities:  Hearing? 0  Vision? 0  Difficulty concentrating or making decisions? 0  Walking or climbing stairs? 0  Dressing or bathing? 0  Doing errands, shopping? 0  Preparing Food and eating ? N  Using the Toilet? N  In the past six months, have you accidently leaked urine? N  Do  you have problems with loss of bowel control? N  Managing your Medications? N  Managing your Finances? N  Housekeeping or managing your Housekeeping? N    Patient Care Team: Adelia Homestead, MD as PCP - General (Internal Medicine)  Indicate any recent Medical Services you may have received from other than [facility] providers in the past year (date may be approximate).     Assessment:    This is a routine wellness examination for Patricia Dillon.  Hearing/Vision screen Hearing Screening - Comments:: Denies hearing difficulties   Vision Screening - Comments:: Wear eyeglass/ Dr. Carmelo Chock   Goals Addressed               This Visit's Progress     Patient Stated (pt-stated)        Try to gain weight/2025       Depression Screen     05/29/2023    2:09 PM 05/26/2022    9:33 AM 05/16/2022    9:03 AM 04/07/2022   10:50 AM 06/13/2021    9:46 AM 05/17/2021    9:55 AM 04/25/2021    9:34 AM  PHQ 2/9 Scores  PHQ - 2 Score 0 0 0 0 0 0 0  PHQ- 9 Score 0 0 0 0 0      Fall Risk     05/24/2023   12:00 PM 10/18/2022    8:33 AM 09/12/2022    2:27 PM 05/24/2022    8:16 AM 05/16/2022    9:03 AM  Fall Risk   Falls in the past year? 0 0 0 0 0  Number falls in past yr: 0 0 0 0 0   Injury with Fall? 0 0 0 0 0  Risk for fall due to :    No Fall Risks   Follow up Falls evaluation completed;Falls prevention discussed Falls evaluation completed Falls evaluation completed Falls evaluation completed Falls evaluation completed    MEDICARE RISK AT HOME:  Medicare Risk at Home Any stairs in or around the home?: (Patient-Rptd) No Home free of loose throw rugs in walkways, pet beds, electrical cords, etc?: (Patient-Rptd) Yes Adequate lighting in your home to reduce risk of falls?: (Patient-Rptd) Yes Life alert?: (Patient-Rptd) No Use of a cane, walker or w/c?: (Patient-Rptd) No Grab bars in the bathroom?: (Patient-Rptd) No Shower chair or bench in shower?: (Patient-Rptd) No Elevated toilet seat or a handicapped toilet?: (Patient-Rptd) No  TIMED UP AND GO:  Was the test performed?  No  Cognitive Function: Declined/Normal: No cognitive concerns noted by patient or family. Patient alert, oriented, able to answer questions appropriately and recall recent events. No signs of memory loss or confusion.        05/26/2022    9:35 AM 05/17/2021    9:43 AM  6CIT Screen  What Year? 0 points 0 points  What month? 0 points 0 points  What time? 0 points 0 points  Count back from 20 0 points 0 points  Months in reverse 0 points 0 points  Repeat phrase 2 points 0 points  Total Score 2 points 0 points    Immunizations Immunization History  Administered Date(s) Administered   Influenza Split 10/23/2019   Influenza,inj,Quad PF,6+ Mos 11/30/2014, 11/09/2016, 10/12/2017, 09/05/2018   Influenza-Unspecified 10/19/2020, 09/22/2021   PFIZER(Purple Top)SARS-COV-2 Vaccination 01/21/2019, 02/11/2019, 10/08/2019   PNEUMOCOCCAL CONJUGATE-20 10/18/2022   Tdap 01/03/2000, 11/05/2014   Unspecified SARS-COV-2 Vaccination 01/21/2019, 02/11/2019, 10/08/2019, 09/24/2020, 10/13/2021   Zoster Recombinant(Shingrix) 04/26/2023    Screening Tests Health Maintenance  Topic Date Due   Hepatitis C  Screening  Never done   COVID-19 Vaccine (9 - 2024-25 season) 09/03/2022   Zoster Vaccines- Shingrix (2 of 2) 06/21/2023   INFLUENZA VACCINE  08/03/2023   Medicare Annual Wellness (AWV)  05/28/2024   MAMMOGRAM  09/04/2024   DTaP/Tdap/Td (3 - Td or Tdap) 11/04/2024   Colonoscopy  03/02/2027   Pneumonia Vaccine 27+ Years old  Completed   DEXA SCAN  Completed   HPV VACCINES  Aged Out   Meningococcal B Vaccine  Aged Out    Health Maintenance  Health Maintenance Due  Topic Date Due   Hepatitis C Screening  Never done   COVID-19 Vaccine (9 - 2024-25 season) 09/03/2022   Health Maintenance Items Addressed: Hepatitis C Screening, See Nurse Notes  Additional Screening:  Vision Screening: Recommended annual ophthalmology exams for early detection of glaucoma and other disorders of the eye.  Dental Screening: Recommended annual dental exams for proper oral hygiene  Community Resource Referral / Chronic Care Management: CRR required this visit?  No   CCM required this visit?  No   Plan:    I have personally reviewed and noted the following in the patient's chart:   Medical and social history Use of alcohol, tobacco or illicit drugs  Current medications and supplements including opioid prescriptions. Patient is not currently taking opioid prescriptions. Functional ability and status Nutritional status Physical activity Advanced directives List of other physicians Hospitalizations, surgeries, and ER visits in previous 12 months Vitals Screenings to include cognitive, depression, and falls Referrals and appointments  In addition, I have reviewed and discussed with patient certain preventive protocols, quality metrics, and best practice recommendations. A written personalized care plan for preventive services as well as general preventive health recommendations were provided to patient.   Benjimin Hadden L Mikolaj Woolstenhulme, CMA   05/29/2023   After Visit Summary: (MyChart) Due to this being a  telephonic visit, the after visit summary with patients personalized plan was offered to patient via MyChart   Notes: Please refer to Routing Comments.

## 2023-05-29 NOTE — Telephone Encounter (Signed)
 No answer

## 2023-05-29 NOTE — Telephone Encounter (Signed)
 Inbound call from patient stating she is still having hemorrhoids complications. States she previous discussed with Dr. Bridgett Camps an alternative plan of care and is wishing to discuss further. Please advise, thank you.

## 2023-05-29 NOTE — Patient Instructions (Addendum)
 Ms. Patricia Dillon , Thank you for taking time out of your busy schedule to complete your Annual Wellness Visit with me. I enjoyed our conversation and look forward to speaking with you again next year. I, as well as your care team,  appreciate your ongoing commitment to your health goals. Please review the following plan we discussed and let me know if I can assist you in the future. Your Game plan/ To Do List   Follow up Visits: Next Medicare AWV with our clinical staff: 05/29/2024.   Have you seen your provider in the last 6 months (3 months if uncontrolled diabetes)? No Next Office Visit with your provider: Last Office visit was 10/18/2022.  Patient will call to schedule her yearly with     Dr. Nicolette Barrio.  Clinician Recommendations:  Aim for 30 minutes of exercise or brisk walking, 6-8 glasses of water, and 5 servings of fruits and vegetables each day. You are due for a 2nd Shingles vaccine and a Hep C screening.  Keep up the good work.      This is a list of the screening recommended for you and due dates:  Health Maintenance  Topic Date Due   Hepatitis C Screening  Never done   Zoster (Shingles) Vaccine (1 of 2) Never done   COVID-19 Vaccine (9 - 2024-25 season) 09/03/2022   Medicare Annual Wellness Visit  05/26/2023   Flu Shot  08/03/2023   Mammogram  09/04/2024   DTaP/Tdap/Td vaccine (3 - Td or Tdap) 11/04/2024   Colon Cancer Screening  03/02/2027   Pneumonia Vaccine  Completed   DEXA scan (bone density measurement)  Completed   HPV Vaccine  Aged Out   Meningitis B Vaccine  Aged Out    Advanced directives: (Copy Requested) Please bring a copy of your health care power of attorney and living will to the office to be added to your chart at your convenience. You can mail to [facility] [address] [place], [place] [address] or email to [email or web address] Advance Care Planning is important because it:  [x]  Makes sure you receive the medical care that is  consistent with your values, goals, and preferences  [x]  It provides guidance to your family and loved ones and reduces their decisional burden about whether or not they are making the right decisions based on your wishes.  Follow the link provided in your after visit summary or read over the paperwork we have mailed to you to help you started getting your Advance Directives in place. If you need assistance in completing these, please reach out to us  so that we can help you!  See attachments for Preventive Care and Fall Prevention Tips.

## 2023-05-30 NOTE — Telephone Encounter (Signed)
 Attempted to reach patient but no answer. No option to leave voicemail.

## 2023-05-31 NOTE — Telephone Encounter (Signed)
 Spoke to patient. She states that she will call us  back next week. She is currently going out of town for several days and says she needs to speak with us  at a different time.

## 2023-06-05 ENCOUNTER — Ambulatory Visit
Admission: RE | Admit: 2023-06-05 | Discharge: 2023-06-05 | Disposition: A | Discharge status: Home / Self Care | Source: Ambulatory Visit | Attending: Internal Medicine | Admitting: Internal Medicine

## 2023-06-05 DIAGNOSIS — E041 Nontoxic single thyroid nodule: Secondary | ICD-10-CM

## 2023-06-06 ENCOUNTER — Ambulatory Visit: Payer: Self-pay | Admitting: Internal Medicine

## 2023-06-08 ENCOUNTER — Encounter: Payer: Self-pay | Admitting: Internal Medicine

## 2023-07-16 ENCOUNTER — Ambulatory Visit: Payer: Self-pay

## 2023-07-16 ENCOUNTER — Ambulatory Visit (INDEPENDENT_AMBULATORY_CARE_PROVIDER_SITE_OTHER): Admitting: Family

## 2023-07-16 VITALS — BP 136/82 | HR 60 | Temp 98.1°F | Ht 66.5 in | Wt 113.4 lb

## 2023-07-16 DIAGNOSIS — T148XXA Other injury of unspecified body region, initial encounter: Secondary | ICD-10-CM | POA: Diagnosis not present

## 2023-07-16 DIAGNOSIS — R0789 Other chest pain: Secondary | ICD-10-CM | POA: Diagnosis not present

## 2023-07-16 NOTE — Telephone Encounter (Signed)
 Called pt and was able to get in for acute visit with padonda webb today before pt goes out of town [address]

## 2023-07-16 NOTE — Patient Instructions (Signed)
 Anti-inflammatory medication such as Ibuprofen and Aleve will help. If your symptoms persist, please call and I can send in a medication to the pharmacy to help. Enjoy your vacation!!

## 2023-07-16 NOTE — Progress Notes (Signed)
 Acute Office Visit  Subjective:     Patient ID: Patricia Dillon, female    DOB: Nov 30, 1950, 73 y.o.   MRN: [id number]  Chief Complaint  Patient presents with  . Back Pain    Right sided upper back pain that raps around into side of right breast x 3 days ago    HPI Patient is in today with c/o pain to the right upper back that extends to her left side x 3 days. The pain is worse with movement or taking a deep breath. She reports lifting boxes recently as she is preparing to move. Also reports going to an exercise class Wednesday that may have triggered this since she has not exercised in a while. Pain 4/10, has not taken any medications. Described as an ache.   Review of Systems  Constitutional:  Negative for fever.  HENT:  Negative for congestion and sore throat.   Respiratory:  Negative for cough, shortness of breath and wheezing.   Cardiovascular: Negative.   Musculoskeletal:  Positive for back pain.       Right chest wall pain.   Neurological: Negative.   Psychiatric/Behavioral: Negative.    All other systems reviewed and are negative.  Past Medical History:  Diagnosis Date  . Diverticulosis   . External hemorrhoids   . Hyperlipidemia   . Internal hemorrhoids   . Lichen sclerosus   . Mitral valve regurgitation   . Osteopenia   . SVT (supraventricular tachycardia) (HCC)   . Tubular adenoma of colon   . Vitamin B12 deficiency     Social History   Socioeconomic History  . Marital status: Divorced    Spouse name: Not on file  . Number of children: 2  . Years of education: Not on file  . Highest education level: Bachelor's degree (e.g., BA, AB, BS)  Occupational History  . Occupation: retired  Tobacco Use  . Smoking status: Never  . Smokeless tobacco: Never  Vaping Use  . Vaping status: Never Used  Substance and Sexual Activity  . Alcohol use: No    Alcohol/week: 0.0 standard drinks of alcohol  . Drug use: No  . Sexual activity: Not on file  Other Topics  Concern  . Not on file  Social History Narrative   Lives alone/2025   Social Drivers of Health   Financial Resource Strain: Low Risk  (07/16/2023)   Overall Financial Resource Strain (CARDIA)   . Difficulty of Paying Living Expenses: Not hard at all  Food Insecurity: No Food Insecurity (07/16/2023)   Hunger Vital Sign   . Worried About Programme researcher, broadcasting/film/video in the Last Year: Never true   . Ran Out of Food in the Last Year: Never true  Transportation Needs: No Transportation Needs (07/16/2023)   PRAPARE - Transportation   . Lack of Transportation (Medical): No   . Lack of Transportation (Non-Medical): No  Physical Activity: Insufficiently Active (07/16/2023)   Exercise Vital Sign   . Days of Exercise per Week: 2 days   . Minutes of Exercise per Session: 50 min  Stress: No Stress Concern Present (07/16/2023)   [employer] of Occupational Health - Occupational Stress Questionnaire   . Feeling of Stress: Only a little  Social Connections: Socially Isolated (07/16/2023)   Social Connection and Isolation Panel   . Frequency of Communication with Friends and Family: More than three times a week   . Frequency of Social Gatherings with Friends and Family: More than three times a week   .  Attends Religious Services: Never   . Active Member of Clubs or Organizations: No   . Attends Banker Meetings: Not on file   . Marital Status: Divorced  Catering manager Violence: Not At Risk (05/26/2022)   Humiliation, Afraid, Rape, and Kick questionnaire   . Fear of Current or Ex-Partner: No   . Emotionally Abused: No   . Physically Abused: No   . Sexually Abused: No    Past Surgical History:  Procedure Laterality Date  . BREAST BIOPSY Left    benign  . BREAST EXCISIONAL BIOPSY Left   . COLONOSCOPY     2002, 2006, 2012 at [place]     Family History  Problem Relation Age of Onset  . Hypertension Mother   . Heart disease Father   . Melanoma Daughter        age 72  . Colon  polyps Daughter   . Colon polyps Son   . Colon cancer Neg Hx   . Esophageal cancer Neg Hx   . Rectal cancer Neg Hx   . Ulcerative colitis Neg Hx     Allergies  Allergen Reactions  . Vancomycin Itching  . Penicillins Rash  . Azithromycin  Hives and Itching    Current Outpatient Medications on File Prior to Visit  Medication Sig Dispense Refill  . betamethasone dipropionate (DIPROLENE) 0.05 % ointment Apply topically.    . Cholecalciferol (VITAMIN D3) 50 MCG (2000 UT) capsule Take 2,000 Units by mouth daily.    . vitamin B-12 (CYANOCOBALAMIN ) 500 MCG tablet Take 500 mcg by mouth daily. Brand: Megafood, includes Folate 340 mcg, vit B6 - 8 mg, includes food blend    . clobetasol cream (TEMOVATE) 0.05 % Apply 1 application  topically as needed. (Patient not taking: Reported on 07/16/2023)     No current facility-administered medications on file prior to visit.    BP 136/82 (BP Location: Left Arm, Patient Position: Sitting, Cuff Size: Normal)   Pulse 60   Temp 98.1 F (36.7 C) (Oral)   Ht 5' 6.5 (1.689 m)   Wt 113 lb 6.4 oz (51.4 kg)   SpO2 98%   BMI 18.03 kg/m chart      Objective:    BP (!) 144/80 (BP Location: Left Arm, Patient Position: Sitting, Cuff Size: Normal)   Pulse 60   Temp 98.1 F (36.7 C) (Oral)   Ht 5' 6.5 (1.689 m)   Wt 113 lb 6.4 oz (51.4 kg)   SpO2 98%   BMI 18.03 kg/m    Physical Exam Vitals and nursing note reviewed.  Constitutional:      Appearance: Normal appearance. She is normal weight.  Cardiovascular:     Rate and Rhythm: Normal rate and regular rhythm.  Pulmonary:     Effort: Pulmonary effort is normal. No respiratory distress.     Breath sounds: Normal breath sounds. No wheezing, rhonchi or rales.  Musculoskeletal:        General: Tenderness present.       Arms:     Cervical back: Normal range of motion and neck supple.     Comments: Chest wall tenderness to palpation and with rotation. No swelling or redness.   Skin:    General:  Skin is warm and dry.  Neurological:     General: No focal deficit present.     Mental Status: She is alert and oriented to person, place, and time. Mental status is at baseline.  Psychiatric:  Mood and Affect: Mood normal.        Behavior: Behavior normal.        Thought Content: Thought content normal.    No results found for any visits on 07/16/23.      Assessment & Plan:   Problem List Items Addressed This Visit   None Visit Diagnoses       Costochondral chest pain    -  Primary     Muscle strain           No orders of the defined types were placed in this encounter.   No follow-ups on file.  Jaycie Kregel B Kyra Laffey, FNP

## 2023-07-16 NOTE — Telephone Encounter (Signed)
 FYI Only or Action Required?: Action required by provider: refusing ED, requesting appt.  Patient was last seen in primary care on 10/18/2022 by Rollene Almarie LABOR, MD.  Called Nurse Triage reporting Shortness of Breath, Back Pain, and Chest Pain.  Symptoms began several days ago.  Interventions attempted: Rest, hydration, or home remedies.  Symptoms are: persisting.  Triage Disposition: Go to ED Now (Notify PCP)  Patient/caregiver understands and will follow disposition?: No, wishes to speak with PCP     Alerted CAL to refusal.  Copied from CRM [id number]. Topic: Clinical - Red Word Triage >> Jul 16, 2023  9:36 AM Kevelyn M wrote: Red Word that prompted transfer to Nurse Triage: Right upper back pain and shortness of breath for 3 days Reason for Disposition  Difficulty breathing  Answer Assessment - Initial Assessment Questions Advised pt go to ED for symptoms, pt refusing at this time, wants to hear from Dr. Rollene or exam in office first, pt then clarifies that she does have SOB intermittently so not new for me. Advised that sending message to office for pt to receive call back with further recommendations/appt options, advised go to hospital if any worsening symptoms or new symptoms like nausea or sweating.   1. RESPIRATORY STATUS: Describe your breathing? (e.g., wheezing, shortness of breath, unable to speak, severe coughing)      Between upper back and right side, curves around Takes exercise class so don't know if pulled something, pain started a couple days later Not bothering me when sleep, but now, aches, during the day there all the time 2. ONSET: When did this breathing problem begin?      3 days ago 3. PATTERN Does the difficult breathing come and go, or has it been constant since it started?      Constant 4. SEVERITY: How bad is your breathing? (e.g., mild, moderate, severe)      Feels like can't take a deep breath, breathing kind of shallow, not  struggling to breathe, keep trying to inhale more air and have to force myself to do that 5. RECURRENT SYMPTOM: Have you had difficulty breathing before? If Yes, ask: When was the last time? and What happened that time?      No, saw pulm 5 years ago for shallow breathing thing but no meds at that time, could try but borderline so we didn't pursue that 6. CARDIAC HISTORY: Do you have any history of heart disease? (e.g., heart attack, angina, bypass surgery, angioplasty)      significant 7. LUNG HISTORY: Do you have any history of lung disease?  (e.g., pulmonary embolus, asthma, emphysema)     no 9. OTHER SYMPTOMS: Do you have any other symptoms? (e.g., chest pain, cough, dizziness, fever, runny nose)     No cough, pain to center of chest, or dizziness, no fever or runny nose  Protocols used: Breathing Difficulty-A-AH, Chest Pain-A-AH

## 2023-07-31 ENCOUNTER — Other Ambulatory Visit: Payer: Self-pay | Admitting: Obstetrics and Gynecology

## 2023-07-31 DIAGNOSIS — Z1231 Encounter for screening mammogram for malignant neoplasm of breast: Secondary | ICD-10-CM

## 2023-08-22 ENCOUNTER — Ambulatory Visit (INDEPENDENT_AMBULATORY_CARE_PROVIDER_SITE_OTHER): Admitting: Internal Medicine

## 2023-08-22 ENCOUNTER — Encounter: Payer: Self-pay | Admitting: Internal Medicine

## 2023-08-22 VITALS — BP 114/68 | HR 61 | Temp 97.9°F | Ht 66.5 in | Wt 112.0 lb

## 2023-08-22 DIAGNOSIS — K591 Functional diarrhea: Secondary | ICD-10-CM | POA: Diagnosis not present

## 2023-08-22 DIAGNOSIS — R7301 Impaired fasting glucose: Secondary | ICD-10-CM | POA: Diagnosis not present

## 2023-08-22 DIAGNOSIS — R634 Abnormal weight loss: Secondary | ICD-10-CM | POA: Diagnosis not present

## 2023-08-22 DIAGNOSIS — R197 Diarrhea, unspecified: Secondary | ICD-10-CM | POA: Insufficient documentation

## 2023-08-22 DIAGNOSIS — E611 Iron deficiency: Secondary | ICD-10-CM | POA: Diagnosis not present

## 2023-08-22 DIAGNOSIS — E041 Nontoxic single thyroid nodule: Secondary | ICD-10-CM

## 2023-08-22 DIAGNOSIS — E559 Vitamin D deficiency, unspecified: Secondary | ICD-10-CM

## 2023-08-22 DIAGNOSIS — R5383 Other fatigue: Secondary | ICD-10-CM

## 2023-08-22 DIAGNOSIS — E538 Deficiency of other specified B group vitamins: Secondary | ICD-10-CM | POA: Diagnosis not present

## 2023-08-22 LAB — VITAMIN D 25 HYDROXY (VIT D DEFICIENCY, FRACTURES): VITD: 47.7 ng/mL (ref 30.00–100.00)

## 2023-08-22 LAB — FERRITIN: Ferritin: 58.1 ng/mL (ref 10.0–291.0)

## 2023-08-22 LAB — COMPREHENSIVE METABOLIC PANEL WITH GFR
ALT: 27 U/L (ref 0–35)
AST: 33 U/L (ref 0–37)
Albumin: 4.5 g/dL (ref 3.5–5.2)
Alkaline Phosphatase: 96 U/L (ref 39–117)
BUN: 23 mg/dL (ref 6–23)
CO2: 30 meq/L (ref 19–32)
Calcium: 9.3 mg/dL (ref 8.4–10.5)
Chloride: 102 meq/L (ref 96–112)
Creatinine, Ser: 0.98 mg/dL (ref 0.40–1.20)
GFR: 57.98 mL/min — ABNORMAL LOW (ref >60.00)
Glucose, Bld: 68 mg/dL — ABNORMAL LOW (ref 70–99)
Potassium: 4.4 meq/L (ref 3.5–5.1)
Sodium: 140 meq/L (ref 135–145)
Total Bilirubin: 0.6 mg/dL (ref 0.2–1.2)
Total Protein: 7.2 g/dL (ref 6.0–8.3)

## 2023-08-22 LAB — CBC
HCT: 41.5 % (ref 36.0–46.0)
Hemoglobin: 13.5 g/dL (ref 12.0–15.0)
MCHC: 32.6 g/dL (ref 30.0–36.0)
MCV: 92.2 fl (ref 78.0–100.0)
Platelets: 111 K/uL — ABNORMAL LOW (ref 150.0–400.0)
RBC: 4.51 Mil/uL (ref 3.87–5.11)
RDW: 13.7 % (ref 11.5–15.5)
WBC: 4.7 K/uL (ref 4.0–10.5)

## 2023-08-22 LAB — HEMOGLOBIN A1C: Hgb A1c MFr Bld: 6 % (ref 4.6–6.5)

## 2023-08-22 LAB — TSH: TSH: 1.88 u[IU]/mL (ref 0.35–5.50)

## 2023-08-22 LAB — LIPASE: Lipase: 31 U/L (ref 11.0–59.0)

## 2023-08-22 LAB — T4, FREE: Free T4: 0.87 ng/dL (ref 0.60–1.60)

## 2023-08-22 LAB — VITAMIN B12: Vitamin B-12: 1025 pg/mL — ABNORMAL HIGH (ref 211–911)

## 2023-08-22 NOTE — Assessment & Plan Note (Signed)
 She has had chronic loose stools for years, possibly contributing to malabsorption and weight loss. Hemorrhoids are present and were previously banded without success. Stress and diet may influence symptoms. Monitor gastrointestinal symptoms and dietary intake. Discuss dietary adjustments to address malabsorption and improve stool consistency. She is up to date on colonoscopy and seeing GI in September if weight not improving would recommend they order stool studies.

## 2023-08-22 NOTE — Assessment & Plan Note (Signed)
 Recent US  reviewed and reviewed with patient. No new or changing lesions. Checking TSH and free T4 for metabolic changes with new weight loss.

## 2023-08-22 NOTE — Assessment & Plan Note (Signed)
 Checking TSH and free T4 as well as CBC, CMP, B12, vitamin D , ferritin, lipase to assess metabolic causes. Counseled about diet advised increasing caloric intake to 1700-1800 calories per day with nutrient-dense foods. Re-evaluate weight and symptoms in one month. Consider a CT scan of the chest, abdomen, and pelvis if weight loss persists.

## 2023-08-22 NOTE — Assessment & Plan Note (Signed)
 Checking B12 levels.

## 2023-08-22 NOTE — Progress Notes (Signed)
 Subjective:   Patient ID: Patricia Dillon, female    DOB: 04/26/1950, 73 y.o.   MRN: [id number]  Discussed the use of AI scribe software for clinical note transcription with the patient, who gave verbal consent to proceed.  History of Present Illness Patricia Dillon is a 73 year old female who presents with unexplained weight loss and gastrointestinal symptoms.  She has experienced a weight loss of approximately 8-9 pounds since last fall, with a more noticeable decline starting around March. She attributes some of the weight loss to increased physical activity due to an unplanned move and stress related to her mother's recent dementia diagnosis. Despite these factors, she feels the weight loss is unusual as she has not been exercising excessively and her dietary intake has not significantly changed.  She has a history of loose stools for several years, which she describes as 'more stomach problems than it should.' A colonoscopy was performed about a year ago, and she was advised to use Metamucil, which did not alleviate her symptoms. She experiences loose stools but no significant change in frequency or new symptoms such as blood in stools. She also reports hemorrhoids that were previously banded without success.  She mentions feeling full quickly after eating and has noticed a reduction in her abdominal girth, which she attributes to the weight loss. She has a sensation of increased mucus production, which she discussed with her dentist, but no new respiratory symptoms such as cough or shortness of breath.  She has been under stress due to her mother's health and her recent move, which she feels may contribute to her symptoms.  Review of Systems  Constitutional:  Positive for unexpected weight change.  HENT: Negative.    Eyes: Negative.   Respiratory:  Negative for cough, chest tightness and shortness of breath.   Cardiovascular:  Negative for chest pain, palpitations and leg swelling.   Gastrointestinal:  Positive for diarrhea. Negative for abdominal distention, abdominal pain, constipation, nausea and vomiting.  Musculoskeletal: Negative.   Skin: Negative.   Neurological: Negative.   Psychiatric/Behavioral: Negative.      Objective:  Physical Exam Constitutional:      Appearance: She is well-developed.  HENT:     Head: Normocephalic and atraumatic.  Cardiovascular:     Rate and Rhythm: Normal rate and regular rhythm.  Pulmonary:     Effort: Pulmonary effort is normal. No respiratory distress.     Breath sounds: Normal breath sounds. No wheezing or rales.  Abdominal:     General: Bowel sounds are normal. There is no distension.     Palpations: Abdomen is soft.     Tenderness: There is no abdominal tenderness. There is no rebound.  Musculoskeletal:     Cervical back: Normal range of motion.  Skin:    General: Skin is warm and dry.  Neurological:     Mental Status: She is alert and oriented to person, place, and time.     Coordination: Coordination normal.     Vitals:   08/22/23 0813  BP: 114/68  Pulse: 61  Temp: 97.9 F (36.6 C)  TempSrc: Oral  SpO2: 98%  Weight: 112 lb (50.8 kg)  Height: 5' 6.5 (1.689 m)    Assessment and Plan Assessment & Plan Unintentional weight loss   She has experienced significant weight loss of 8-9 pounds since last fall, likely due to stress and increased physical activity. Differential diagnosis includes malabsorption, thyroid  dysfunction, or malignancy. Thyroid  function is being monitored, and recent  imaging was unremarkable. Order blood work for thyroid  function, vitamin levels, and basic labs. Advise increasing caloric intake to 1700-1800 calories per day with nutrient-dense foods. Re-evaluate weight and symptoms in one month. Consider a CT scan of the chest, abdomen, and pelvis if weight loss persists.  Chronic loose stools   She has had chronic loose stools for years, possibly contributing to malabsorption and  weight loss. Hemorrhoids are present and were previously banded without success. Stress and diet may influence symptoms. Monitor gastrointestinal symptoms and dietary intake. Discuss dietary adjustments to address malabsorption and improve stool consistency.

## 2023-08-27 ENCOUNTER — Ambulatory Visit: Payer: Self-pay | Admitting: Internal Medicine

## 2023-08-28 DIAGNOSIS — M8588 Other specified disorders of bone density and structure, other site: Secondary | ICD-10-CM | POA: Diagnosis not present

## 2023-08-28 DIAGNOSIS — N958 Other specified menopausal and perimenopausal disorders: Secondary | ICD-10-CM | POA: Diagnosis not present

## 2023-08-28 LAB — HM DEXA SCAN

## 2023-09-04 ENCOUNTER — Encounter: Payer: Self-pay | Admitting: Internal Medicine

## 2023-09-07 ENCOUNTER — Ambulatory Visit: Admitting: Nurse Practitioner

## 2023-09-07 ENCOUNTER — Other Ambulatory Visit (INDEPENDENT_AMBULATORY_CARE_PROVIDER_SITE_OTHER)

## 2023-09-07 ENCOUNTER — Encounter: Payer: Self-pay | Admitting: Nurse Practitioner

## 2023-09-07 VITALS — BP 110/72 | HR 60 | Ht 66.5 in | Wt 114.0 lb

## 2023-09-07 DIAGNOSIS — K529 Noninfective gastroenteritis and colitis, unspecified: Secondary | ICD-10-CM

## 2023-09-07 DIAGNOSIS — K648 Other hemorrhoids: Secondary | ICD-10-CM

## 2023-09-07 LAB — SEDIMENTATION RATE: Sed Rate: 17 mm/h (ref 0–30)

## 2023-09-07 LAB — C-REACTIVE PROTEIN: CRP: <1 mg/dL (ref 0.5–20.0)

## 2023-09-07 NOTE — Patient Instructions (Addendum)
 Apply a small amount of Desitin inside the anal opening and to the external anal area three times daily as needed for anal or hemorrhoidal irritation/bleeding.   Imodium - take 1/2 a tablet by mouth daily. May increase to 1 tablet daily as needed, stop if no bowel movement in 24 hrs.   We placed a referral to [facility] and their office will contact you within 5-7 business days to schedule an appointment.   Your provider has requested that you go to the basement level for lab work before leaving today. Press B on the elevator. The lab is located at the first door on the left as you exit the elevator.   _______________________________________________________  If your blood pressure at your visit was 140/90 or greater, please contact your primary care physician to follow up on this.  _______________________________________________________  If you are age 72 or older, your body mass index should be between 23-30. Your Body mass index is 18.12 kg/m. If this is out of the aforementioned range listed, please consider follow up with your Primary Care Provider.  If you are age 51 or younger, your body mass index should be between 19-25. Your Body mass index is 18.12 kg/m. If this is out of the aformentioned range listed, please consider follow up with your Primary Care Provider.   ________________________________________________________  The [facility] providers would like to encourage you to use MYCHART to communicate with providers for non-urgent requests or questions.  Due to long hold times on the telephone, sending your provider a message by [facility] may be a faster and more efficient way to get a response.  Please allow 48 business hours for a response.  Please remember that this is for non-urgent requests.  _______________________________________________________  [facility] is using a team-based approach to care.  Your team is made up of your doctor and two to  three APPS. Our APPS (Nurse Practitioners and Physician Assistants) work with your physician to ensure care continuity for you. They are fully qualified to address your health concerns and develop a treatment plan. They communicate directly with your gastroenterologist to care for you. Seeing the Advanced Practice Practitioners on your physician's team can help you by facilitating care more promptly, often allowing for earlier appointments, access to diagnostic testing, procedures, and other specialty referrals.

## 2023-09-07 NOTE — Progress Notes (Signed)
 09/07/2023 Patricia Dillon [id number] 02-Apr-1950   Chief Complaint: Hemorrhoidal bleeding, loose stools   History of Present Illness: Patricia Dillon is a 73 year old female with a past medical history of MVP, vitamin B 12 deficiency, thrombocytopenia, lichen sclerosis, diverticulosis, colon polyps and hemorrhoids. She is known by Dr. Albertus. She presents today for further evaluation regarding chronic loose stools x 20 years and continues to have hemorrhoidal bleeding and irritation despite hemorrhoid banding x 4, last banding on 11/28/2022. She questions if infrared treatment for her hemorrhoids would be appropriate. She passes 2-3 nonbloody loose stools most days usually in the morning and every now and then then she has watery diarrhea. No associated abdominal pain. No specific food triggers. She recently started using Lactaid milk and lactose-free yogurt. She has lost 5 pounds over the past month. No caffeine intake. She tried Metamucil and Benefiber in the past without improvement. Her most recent colonoscopy was 03/02/2022 which showed diverticulosis in the sigmoid colon and nodular mucosa in the distal rectum. Path report showed polypoid rectal mucosa with inflammation consistent with mucosal prolapse.      Latest Ref Rng & Units 08/22/2023    8:55 AM 09/22/2022    9:57 AM 04/21/2022   10:25 AM  CBC  WBC 4.0 - 10.5 K/uL 4.7  5.5  6.4   Hemoglobin 12.0 - 15.0 g/dL 86.4  86.8  87.3   Hematocrit 36.0 - 46.0 % 41.5  41.1  37.2   Platelets 150.0 - 400.0 K/uL 111.0  114.0  227.0        Latest Ref Rng & Units 08/22/2023    8:55 AM 09/22/2022    9:57 AM 04/21/2022   10:25 AM  CMP  Glucose 70 - 99 mg/dL 68  89  93   BUN 6 - 23 mg/dL 23  19  18    Creatinine 0.40 - 1.20 mg/dL 9.01  8.99  9.09   Sodium 135 - 145 mEq/L 140  140  140   Potassium 3.5 - 5.1 mEq/L 4.4  3.8  4.3   Chloride 96 - 112 mEq/L 102  101  101   CO2 19 - 32 mEq/L 30  29  32   Calcium 8.4 - 10.5 mg/dL 9.3  9.3  9.4    Total Protein 6.0 - 8.3 g/dL 7.2  7.0    Total Bilirubin 0.2 - 1.2 mg/dL 0.6  0.9    Alkaline Phos 39 - 117 U/L 96  93    AST 0 - 37 U/L 33  30    ALT 0 - 35 U/L 27  20    TSH 1.88 on 08/22/2023  PAST GI PROCEDURES:  Colonoscopy 03/02/2022: - Mild diverticulosis in the sigmoid colon.  - Nodular mucosa in the distal rectum. Biopsied to exclude adenoma (prolapse favored).  - Internal hemorrhoids.  - No colonic polyps seen.  - POLYPOID RECTAL MUCOSA WITH INFLAMMATION AND MUSCULARIZATION OF LAMINA PROPRIA, CONSISTENT WITH MUCOSAL PROLAPSE - NEGATIVE FOR DYSPLASIA OR MALIGNANCY  Past Medical History:  Diagnosis Date   Diverticulosis    External hemorrhoids    Hyperlipidemia    Internal hemorrhoids    Lichen sclerosus    Mitral valve regurgitation    Osteopenia    SVT (supraventricular tachycardia) (HCC)    Tubular adenoma of colon    Vitamin B12 deficiency    Past Surgical History:  Procedure Laterality Date   BREAST BIOPSY Left    benign   BREAST EXCISIONAL  BIOPSY Left    COLONOSCOPY     2002, 2006, 2012 at [place]    Current Outpatient Medications on File Prior to Visit  Medication Sig Dispense Refill   betamethasone dipropionate (DIPROLENE) 0.05 % ointment Apply topically.     Cholecalciferol (VITAMIN D3) 50 MCG (2000 UT) capsule Take 2,000 Units by mouth daily.     clobetasol cream (TEMOVATE) 0.05 % Apply 1 application  topically as needed.     vitamin B-12 (CYANOCOBALAMIN ) 500 MCG tablet Take 500 mcg by mouth daily. Brand: Megafood, includes Folate 340 mcg, vit B6 - 8 mg, includes food blend     No current facility-administered medications on file prior to visit.   Allergies  Allergen Reactions   Vancomycin Itching   Penicillins Rash   Azithromycin  Hives and Itching   Current Medications, Allergies, Past Medical History, Past Surgical History, Family History and Social History were reviewed in [employer] record.  Review of Systems:    Constitutional: + 5lb weight loss.  Respiratory: Negative for shortness of breath.   Cardiovascular: Negative for chest pain, palpitations and leg swelling.  Gastrointestinal: See HPI.  Musculoskeletal: Negative for back pain or muscle aches.  Neurological: Negative for dizziness, headaches or paresthesias.   Physical Exam: BP 110/72   Pulse 60   Ht 5' 6.5 (1.689 m)   Wt 114 lb (51.7 kg)   BMI 18.12 kg/m   Wt Readings from Last 3 Encounters:  09/07/23 114 lb (51.7 kg)  08/22/23 112 lb (50.8 kg)  07/16/23 113 lb 6.4 oz (51.4 kg)    General: 72 year old female in no acute distress. Head: Normocephalic and atraumatic. Eyes: No scleral icterus. Conjunctiva pink . Ears: Normal auditory acuity. Mouth: Dentition intact. No ulcers or lesions.  Lungs: Clear throughout to auscultation. Heart: Regular rate and rhythm, no murmur. Abdomen: Soft, nontender and nondistended. No masses or hepatomegaly. Normal bowel sounds x 4 quadrants.  Rectal: Prolapsed anal hemorrhoid friable, see photo below. CMA Shell present at time of exam.   Musculoskeletal: Symmetrical with no gross deformities. Extremities: No edema. Neurological: Alert oriented x 4. No focal deficits.  Psychological: Alert and cooperative. Normal mood and affect  Assessment and Recommendations:  73 year old female with internal hemorrhoids status post banding x 4 with recurrent bright red rectal bleeding and hemorrhoidal irritation with prolapse.  See photo above. Chronic loose stools contributing to hemorrhoidal irritation. - Apply a small amount of Desitin inside the anal opening and to the external anal area three times daily as needed for anal or hemorrhoidal irritation/bleeding.  - Refer to colorectal surgery for further hemorrhoidal management, consider possible future hemorrhoidectomy  - See plan below regarding loose stool management  Chronic loose stools. Benefiber and Metamucil were ineffective. - Imodium one half  tab p.o. once daily, may increase to 1 tab as needed.  Stop if no BM in 24 hours. - Discussed eating a green banana or dried apple chips which contain pectin ( a constipating natural fiber) - TTG, IgA, food allergy panel, sed rate and CRP  Patient reported 5lb weight loss - Follow-up with PCP for weight loss - Recommend CTAP if weight loss persists

## 2023-09-09 ENCOUNTER — Ambulatory Visit: Payer: Self-pay | Admitting: Nurse Practitioner

## 2023-09-11 ENCOUNTER — Ambulatory Visit
Admission: RE | Admit: 2023-09-11 | Discharge: 2023-09-11 | Disposition: A | Discharge status: Home / Self Care | Source: Ambulatory Visit | Attending: Obstetrics and Gynecology | Admitting: Obstetrics and Gynecology

## 2023-09-11 DIAGNOSIS — Z1231 Encounter for screening mammogram for malignant neoplasm of breast: Secondary | ICD-10-CM | POA: Diagnosis not present

## 2023-09-14 LAB — EGG COMPONENT PANEL REFLEX
Allergen, Ovalbumin, f232: 0.13 kU/L — ABNORMAL HIGH
Allergen, Ovomucoid, f233: 0.25 kU/L — ABNORMAL HIGH

## 2023-09-14 LAB — FOOD ALLERGY PROFILE
Allergen, Salmon, f41: <0.1 kU/L
Almonds: <0.1 kU/L
Brazil Nut: <0.1 kU/L
CLASS: 0
CLASS: 0
CLASS: 0
CLASS: 0
CLASS: 0
CLASS: 0
CLASS: 0
CLASS: 0
CLASS: 0
CLASS: 0
CLASS: 0
Cashew IgE: <0.1 kU/L
Class: 0
Class: 0
Class: 0
Class: 0
Egg White IgE: 0.26 kU/L — ABNORMAL HIGH
Fish Cod: <0.1 kU/L
Hazelnut: 0.11 kU/L — ABNORMAL HIGH
Macadamia Nut: <0.1 kU/L
Milk IgE: <0.1 kU/L
Peanut IgE: <0.1 kU/L
Scallop IgE: <0.1 kU/L
Sesame Seed f10: <0.1 kU/L
Shrimp IgE: <0.1 kU/L
Soybean IgE: <0.1 kU/L
Tuna IgE: <0.1 kU/L
Walnut: <0.1 kU/L
Wheat IgE: <0.1 kU/L

## 2023-09-14 LAB — IGA: Immunoglobulin A: 235 mg/dL (ref 70–320)

## 2023-09-14 LAB — TISSUE TRANSGLUTAMINASE, IGA: (tTG) Ab, IgA: <1 U/mL

## 2023-09-14 LAB — MISC HAZELNUT COMP PNL
Cor a1(f428): 0.17 kU/L — ABNORMAL HIGH (ref <0.10)
Cor a14(f439): <0.1 kU/L (ref <0.10)
Cor a8(f425): <0.1 kU/L (ref <0.10)
Cor a9(f440): <0.1 kU/L (ref <0.10)

## 2023-09-14 LAB — INTERPRETATION:

## 2023-09-25 ENCOUNTER — Ambulatory Visit (INDEPENDENT_AMBULATORY_CARE_PROVIDER_SITE_OTHER): Admitting: Internal Medicine

## 2023-09-25 ENCOUNTER — Encounter: Payer: Self-pay | Admitting: Internal Medicine

## 2023-09-25 VITALS — BP 132/72 | HR 56 | Temp 98.3°F | Resp 18 | Ht 66.5 in | Wt 116.6 lb

## 2023-09-25 DIAGNOSIS — R634 Abnormal weight loss: Secondary | ICD-10-CM | POA: Diagnosis not present

## 2023-09-25 NOTE — Assessment & Plan Note (Signed)
 Weight increased by four pounds, indicating improvement. Increased caloric intake and meal frequency improved energy levels. Increase caloric intake with calorie-rich foods. Monitor for any changes in weight or symptoms

## 2023-09-25 NOTE — Progress Notes (Signed)
 Subjective:   Patient ID: Patricia Dillon, female    DOB: Jul 28, 1950, 73 y.o.   MRN: [id number]  Discussed the use of AI scribe software for clinical note transcription with the patient, who gave verbal consent to proceed.  History of Present Illness Patricia Dillon is a 73 year old female who presents with weight management and gastrointestinal issues.  She has experienced a weight increase of approximately three pounds at home, consistent with a four-pound increase noted in the clinic. She attributes this to consuming more calorie-rich foods and eating more frequently throughout the day. She reports feeling more energetic.  She has been experiencing gastrointestinal issues, including diarrhea, which is not severe but abnormal for her. She has not started Imodium due to a recent shingles vaccination. She plans to try Metamucil again, as it previously worked but may not have been given enough time. She has an upcoming appointment with [facility] for hemorrhoid treatment, as previous banding procedures were ineffective. She also has lichen sclerosus, which is not currently flaring up.  She reports new pain in her pinky finger joint, which she suspects might be a ganglion cyst or arthritis, as she has had ganglion cysts before. She does not recall any injury to the finger. Previously, she experienced bruising on her fingers, which improved with a higher dose of B12.  She has been taking a high dose of B12, which she is considering switching to a multivitamin, but finds the multivitamin pill size challenging. Her B12 dose is significantly higher than the multivitamin's.  She mentions a history of low platelet counts, which were previously resolved by using a different blood collection tube.  Review of Systems  Constitutional: Negative.   HENT: Negative.    Eyes: Negative.   Respiratory:  Negative for cough, chest tightness and shortness of breath.   Cardiovascular:  Negative for chest  pain, palpitations and leg swelling.  Gastrointestinal:  Negative for abdominal distention, abdominal pain, constipation, diarrhea, nausea and vomiting.  Musculoskeletal: Negative.   Skin: Negative.   Neurological: Negative.   Psychiatric/Behavioral: Negative.      Objective:  Physical Exam Constitutional:      Appearance: She is well-developed.  HENT:     Head: Normocephalic and atraumatic.  Cardiovascular:     Rate and Rhythm: Normal rate and regular rhythm.  Pulmonary:     Effort: Pulmonary effort is normal. No respiratory distress.     Breath sounds: Normal breath sounds. No wheezing or rales.  Abdominal:     General: Bowel sounds are normal. There is no distension.     Palpations: Abdomen is soft.     Tenderness: There is no abdominal tenderness.  Musculoskeletal:     Cervical back: Normal range of motion.  Skin:    General: Skin is warm and dry.  Neurological:     Mental Status: She is alert and oriented to person, place, and time.     Coordination: Coordination normal.     Vitals:   09/25/23 1036  BP: 132/72  Pulse: (!) 56  Resp: 18  Temp: 98.3 F (36.8 C)  SpO2: 97%  Weight: 116 lb 9.6 oz (52.9 kg)  Height: 5' 6.5 (1.689 m)    Assessment and Plan Assessment & Plan Unintentional weight loss, improved   Weight increased by four pounds, indicating improvement. Increased caloric intake and meal frequency improved energy levels. Increase caloric intake with calorie-rich foods. Monitor for any changes in weight or symptoms.  Right pinky finger pain, possible  ganglion cyst or sprain   Pain in right pinky joint with no palpable cyst. Differential includes ganglion cyst, sprain, or arthritis. Monitor right pinky finger pain for changes or worsening symptoms.

## 2023-10-02 DIAGNOSIS — K642 Third degree hemorrhoids: Secondary | ICD-10-CM | POA: Diagnosis not present

## 2023-10-03 ENCOUNTER — Encounter: Payer: Self-pay | Admitting: Internal Medicine

## 2023-10-03 NOTE — Telephone Encounter (Signed)
 Please advise as Md is out of office

## 2023-10-03 NOTE — Telephone Encounter (Signed)
 This question can wait for her PCP to come back to the office.

## 2023-10-15 NOTE — Progress Notes (Signed)
 Addendum: Reviewed and agree with assessment and management plan. Given nonresponse to hemorrhoidal banding x 4 agree with colorectal surgery opinion for hemorrhoidectomy Hans Rusher, Gordy HERO, MD

## 2023-10-16 ENCOUNTER — Encounter: Payer: Self-pay | Admitting: Internal Medicine

## 2023-10-16 DIAGNOSIS — Z23 Encounter for immunization: Secondary | ICD-10-CM | POA: Diagnosis not present

## 2023-10-16 NOTE — Telephone Encounter (Signed)
 My apologies I ended up routing this back to Dr segardia again by accident can pt receive T-dap vaccine

## 2023-11-01 ENCOUNTER — Encounter: Payer: Self-pay | Admitting: Podiatry

## 2023-11-01 ENCOUNTER — Ambulatory Visit (INDEPENDENT_AMBULATORY_CARE_PROVIDER_SITE_OTHER): Admitting: Podiatry

## 2023-11-01 DIAGNOSIS — M2041 Other hammer toe(s) (acquired), right foot: Secondary | ICD-10-CM

## 2023-11-01 DIAGNOSIS — M216X1 Other acquired deformities of right foot: Secondary | ICD-10-CM | POA: Diagnosis not present

## 2023-11-01 DIAGNOSIS — M2042 Other hammer toe(s) (acquired), left foot: Secondary | ICD-10-CM

## 2023-11-01 NOTE — Progress Notes (Signed)
 Subjective: Chief Complaint  Patient presents with   Callouses    Patient is here for right foot callous trim     73 y.o. female presents the office with above concerns, foot pain mostly in the right foot where she gets a callus.  She has been submetatarsal 5 that she points to.  No swelling, redness or any drainage.  No open lesions.  She does not recall any injuries to the area is tender with pressure.  She is concerned that for a while she has noticed her tendons contracting the top of her foot.  Objective: AAO x3, NAD DP/PT pulses palpable bilaterally, CRT less than 3 seconds Hyperkeratotic lesion on the right foot submetatarsal 5.  There is no underlying ulceration, drainage or signs of infection.  There is palpation of the area.  There is prominent metatarsal heads plantarly without to the fat pad.  Hammertoe contractures noted.  No other areas of discomfort. No pain with calf compression, swelling, warmth, erythema  Assessment: Hyperkeratotic lesion right foot due to prominent metatarsal heads; hammertoes/tendon contractures  Plan: -All treatment options discussed with the patient including all alternatives, risks, complications.  -Sharply debrided x 1 without any complications or bleeding as courtesy.  Discussed moisturizer, offloading.  This is likely due to the prominent metatarsal head but after that the fat pad IMU to work on shoes, offloading. - She has extensor tendon contractures and hammertoe contractures noted.  Discussed exercises to help with this.  Return if symptoms worsen or fail to improve.  Donnice JONELLE Fees DPM

## 2024-02-08 ENCOUNTER — Telehealth: Payer: Self-pay | Admitting: Internal Medicine

## 2024-02-08 NOTE — Telephone Encounter (Signed)
 Patient called and is requesting that the order for her Thyroid  US  be put in so that she can get scheduled prior to her visit with Dr Trixie on 05.26.26

## 2024-02-11 ENCOUNTER — Ambulatory Visit: Admitting: Emergency Medicine

## 2024-05-27 ENCOUNTER — Ambulatory Visit: Admitting: Internal Medicine

## 2024-05-29 ENCOUNTER — Ambulatory Visit
